# Patient Record
Sex: Female | Born: 1975 | Race: White | Hispanic: No | Marital: Married | Smoking: Current every day smoker
Health system: Southern US, Community
[De-identification: ages and names within clinical notes are randomized; demographics above are authoritative.]

## PROBLEM LIST (undated history)

## (undated) HISTORY — PX: KIDNEY STONE SURGERY: SHX686

## (undated) HISTORY — PX: ABDOMINAL HYSTERECTOMY: SHX81

---

## 2000-05-12 ENCOUNTER — Encounter (INDEPENDENT_AMBULATORY_CARE_PROVIDER_SITE_OTHER): Payer: Self-pay

## 2000-05-12 ENCOUNTER — Other Ambulatory Visit: Admission: RE | Admit: 2000-05-12 | Discharge: 2000-05-12 | Payer: Self-pay | Admitting: Obstetrics and Gynecology

## 2000-06-02 ENCOUNTER — Encounter (INDEPENDENT_AMBULATORY_CARE_PROVIDER_SITE_OTHER): Payer: Self-pay | Admitting: Specialist

## 2000-06-02 ENCOUNTER — Other Ambulatory Visit: Admission: RE | Admit: 2000-06-02 | Discharge: 2000-06-02 | Payer: Self-pay | Admitting: Obstetrics and Gynecology

## 2000-08-20 ENCOUNTER — Other Ambulatory Visit: Admission: RE | Admit: 2000-08-20 | Discharge: 2000-08-20 | Payer: Self-pay | Admitting: Obstetrics and Gynecology

## 2000-12-06 ENCOUNTER — Inpatient Hospital Stay (HOSPITAL_COMMUNITY): Admission: AD | Admit: 2000-12-06 | Discharge: 2000-12-06 | Payer: Self-pay | Admitting: *Deleted

## 2001-03-19 ENCOUNTER — Inpatient Hospital Stay (HOSPITAL_COMMUNITY): Admission: AD | Admit: 2001-03-19 | Discharge: 2001-03-22 | Payer: Self-pay | Admitting: Obstetrics and Gynecology

## 2001-05-27 ENCOUNTER — Encounter (INDEPENDENT_AMBULATORY_CARE_PROVIDER_SITE_OTHER): Payer: Self-pay

## 2001-05-27 ENCOUNTER — Ambulatory Visit (HOSPITAL_COMMUNITY): Admission: RE | Admit: 2001-05-27 | Discharge: 2001-05-27 | Payer: Self-pay | Admitting: Obstetrics and Gynecology

## 2001-06-21 ENCOUNTER — Other Ambulatory Visit: Admission: RE | Admit: 2001-06-21 | Discharge: 2001-06-21 | Payer: Self-pay | Admitting: Obstetrics and Gynecology

## 2002-09-14 ENCOUNTER — Other Ambulatory Visit: Admission: RE | Admit: 2002-09-14 | Discharge: 2002-09-14 | Payer: Self-pay | Admitting: Obstetrics and Gynecology

## 2002-12-13 ENCOUNTER — Emergency Department (HOSPITAL_COMMUNITY): Admission: EM | Admit: 2002-12-13 | Discharge: 2002-12-13 | Payer: Self-pay | Admitting: Emergency Medicine

## 2002-12-13 ENCOUNTER — Encounter: Payer: Self-pay | Admitting: Emergency Medicine

## 2003-04-04 ENCOUNTER — Encounter: Payer: Self-pay | Admitting: Family Medicine

## 2003-04-04 ENCOUNTER — Ambulatory Visit (HOSPITAL_COMMUNITY): Admission: RE | Admit: 2003-04-04 | Discharge: 2003-04-04 | Payer: Self-pay | Admitting: Family Medicine

## 2003-07-22 ENCOUNTER — Emergency Department (HOSPITAL_COMMUNITY): Admission: EM | Admit: 2003-07-22 | Discharge: 2003-07-22 | Payer: Self-pay | Admitting: Emergency Medicine

## 2003-09-18 ENCOUNTER — Other Ambulatory Visit: Admission: RE | Admit: 2003-09-18 | Discharge: 2003-09-18 | Payer: Self-pay | Admitting: Obstetrics and Gynecology

## 2003-09-21 ENCOUNTER — Encounter: Admission: RE | Admit: 2003-09-21 | Discharge: 2003-09-21 | Payer: Self-pay | Admitting: Obstetrics and Gynecology

## 2004-01-04 ENCOUNTER — Ambulatory Visit (HOSPITAL_COMMUNITY): Admission: RE | Admit: 2004-01-04 | Discharge: 2004-01-04 | Payer: Self-pay | Admitting: Family Medicine

## 2004-01-16 ENCOUNTER — Ambulatory Visit (HOSPITAL_COMMUNITY): Admission: RE | Admit: 2004-01-16 | Discharge: 2004-01-16 | Payer: Self-pay | Admitting: Family Medicine

## 2004-05-15 ENCOUNTER — Ambulatory Visit (HOSPITAL_COMMUNITY): Admission: RE | Admit: 2004-05-15 | Discharge: 2004-05-15 | Payer: Self-pay | Admitting: Family Medicine

## 2004-05-21 ENCOUNTER — Ambulatory Visit (HOSPITAL_COMMUNITY): Admission: RE | Admit: 2004-05-21 | Discharge: 2004-05-21 | Payer: Self-pay | Admitting: Family Medicine

## 2004-06-04 ENCOUNTER — Ambulatory Visit (HOSPITAL_COMMUNITY): Admission: RE | Admit: 2004-06-04 | Discharge: 2004-06-04 | Payer: Self-pay | Admitting: Family Medicine

## 2004-10-02 ENCOUNTER — Other Ambulatory Visit: Admission: RE | Admit: 2004-10-02 | Discharge: 2004-10-02 | Payer: Self-pay | Admitting: Obstetrics and Gynecology

## 2004-10-12 ENCOUNTER — Emergency Department (HOSPITAL_COMMUNITY): Admission: EM | Admit: 2004-10-12 | Discharge: 2004-10-12 | Payer: Self-pay | Admitting: Emergency Medicine

## 2004-11-01 ENCOUNTER — Emergency Department (HOSPITAL_COMMUNITY): Admission: EM | Admit: 2004-11-01 | Discharge: 2004-11-01 | Payer: Self-pay | Admitting: Emergency Medicine

## 2004-11-06 ENCOUNTER — Ambulatory Visit (HOSPITAL_COMMUNITY): Admission: RE | Admit: 2004-11-06 | Discharge: 2004-11-06 | Payer: Self-pay | Admitting: Urology

## 2004-12-30 ENCOUNTER — Ambulatory Visit: Payer: Self-pay | Admitting: Internal Medicine

## 2005-01-06 ENCOUNTER — Ambulatory Visit (HOSPITAL_COMMUNITY): Admission: RE | Admit: 2005-01-06 | Discharge: 2005-01-06 | Payer: Self-pay | Admitting: Internal Medicine

## 2005-01-06 ENCOUNTER — Ambulatory Visit: Payer: Self-pay | Admitting: Internal Medicine

## 2005-04-12 IMAGING — CT CT PELVIS W/O CM
1 of 2 series · 15 of 32 positions shown, 19 images · non-contrast
Comparison: none

CLINICAL DATA: left flank pain. History of bilateral renal stones.  Low back pain.
 CT ABDOMEN W/O CONTRAST
 A series of scans of the entire abdomen are made without contrast and are compared to the previous studies of 12/13/02 and show again bilateral renal calculi, the largest appear to be on the left side and measure up to 4 x 7 millimeter.  There is no hydronephrosis or hydroureter at this time. No obstruction or stone of either ureter is seen.
 The lower lung fields, lower thoracic spine and upper lumbar spine appear normal. There is no obstruction or perforation of the bowel. The liver and gallbladder appear normal as far as can be seen.  The pancreas and spleen are normal.
 IMPRESSION
 Bilateral renal calculi with no definite hydronephrosis or hydroureter.  No ureteral calculi are seen.
 CT PELVIS W/O CONTRAST
 CT scan of the pelvis without contrast is made and shows no evidence of distal ureteral calculus or obstruction.  The bladder appears normal.  Uterus is within normal limits. The ovaries are not enlarged. No free fluid or air is seen within the pelvis.  The bones of the pelvis appear normal. Lower lumbar spine is within normal limits.
 Compared to the previous study of 12/13/02, the left distal ureteral calculus now appears to have passed.
 Normal CT scan of the pelvis without contrast.

[Series 70: — · axial · 0.65mm/px · z∈[+1288,+1663]mm · 15 of 83 slices shown, 19 images]
[im 4/83  soft-tissue]
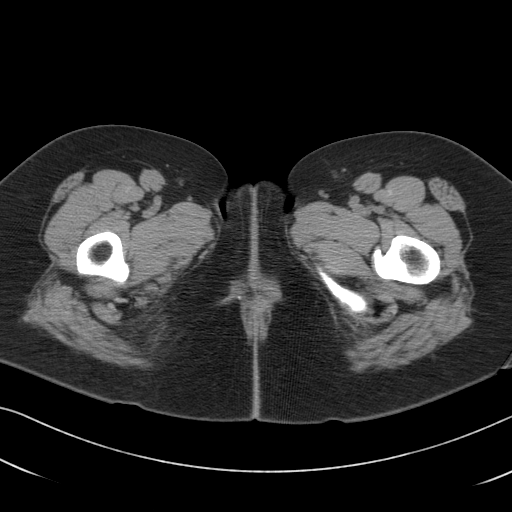
[im 4/83  bone]
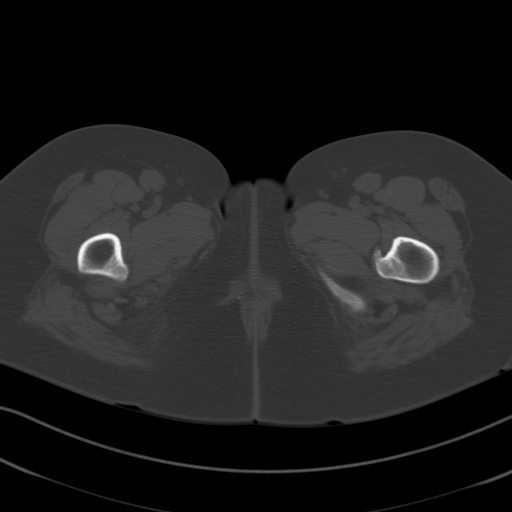
[im 11/83  soft-tissue]
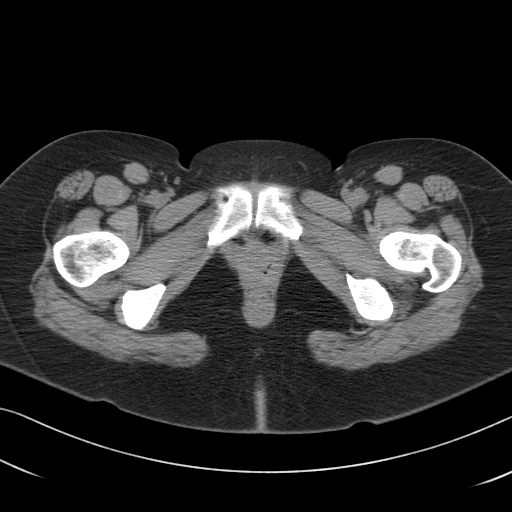
[im 18/83  soft-tissue]
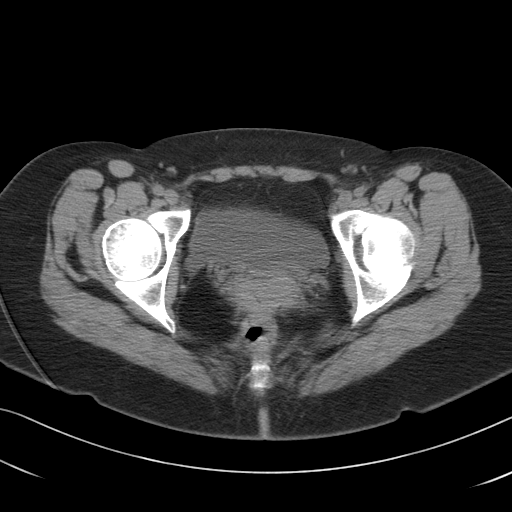
[im 24/83  soft-tissue]
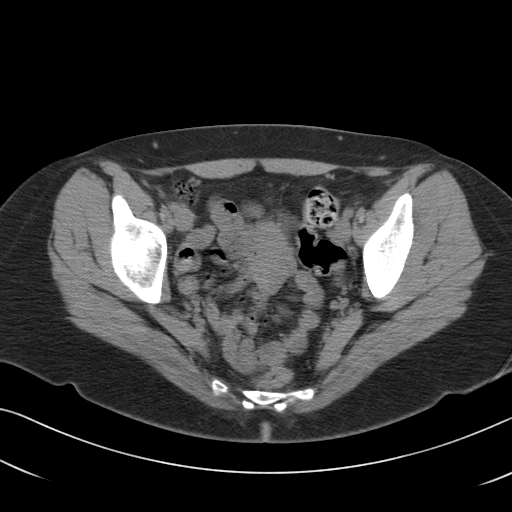
[im 28/83  soft-tissue]
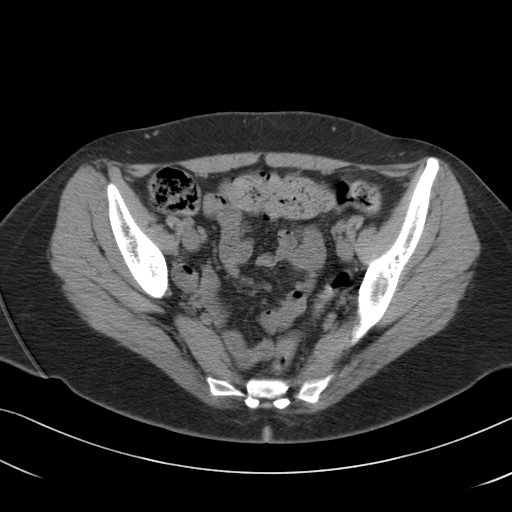
[im 35/83  soft-tissue]
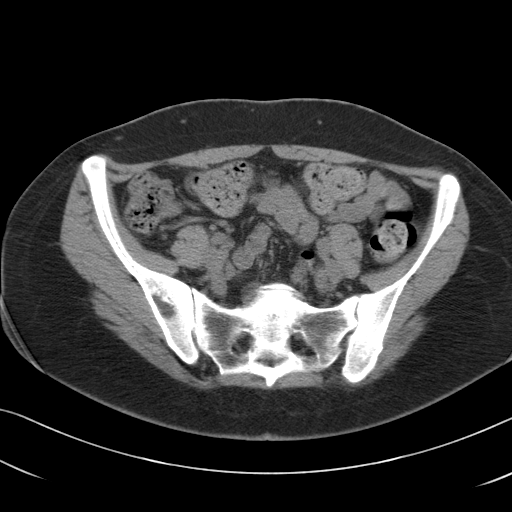
[im 42/83  soft-tissue]
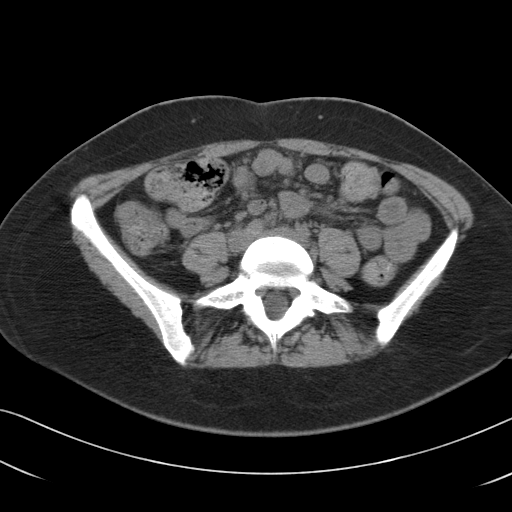
[im 48/83  soft-tissue]
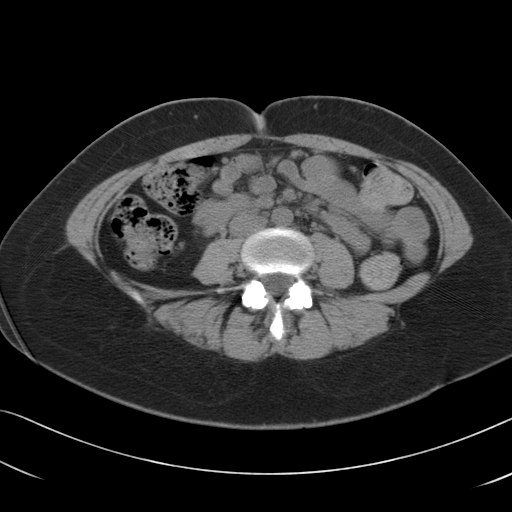
[im 55/83  soft-tissue]
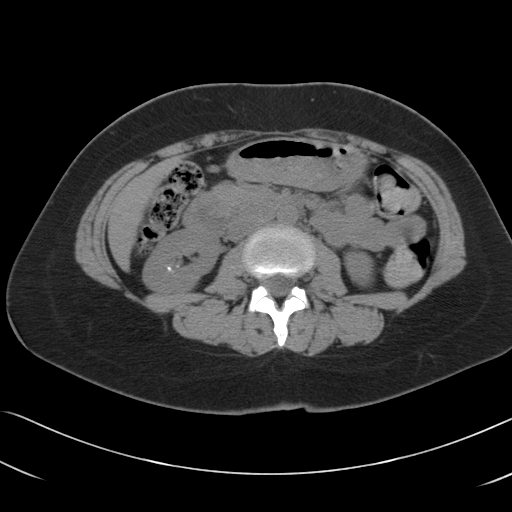
[im 55/83  bone]
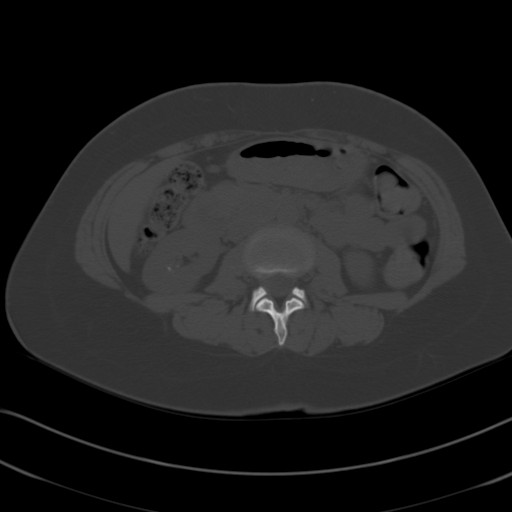
[im 59/83  soft-tissue]
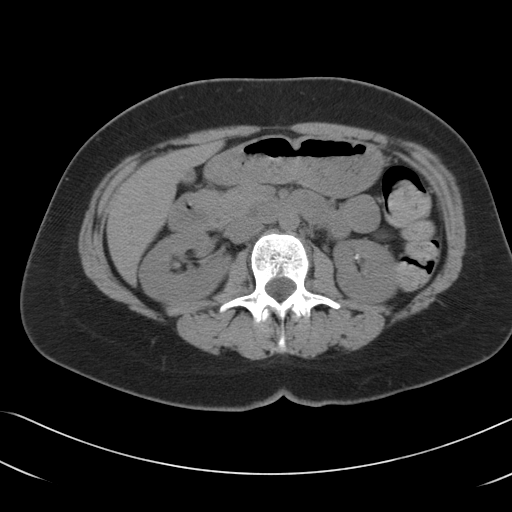
[im 65/83  soft-tissue]
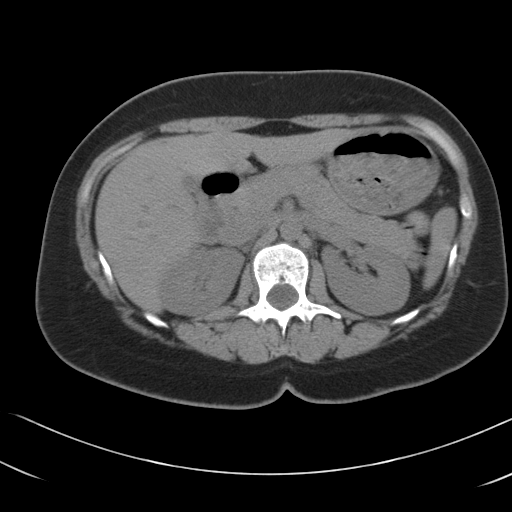
[im 69/83  lung]
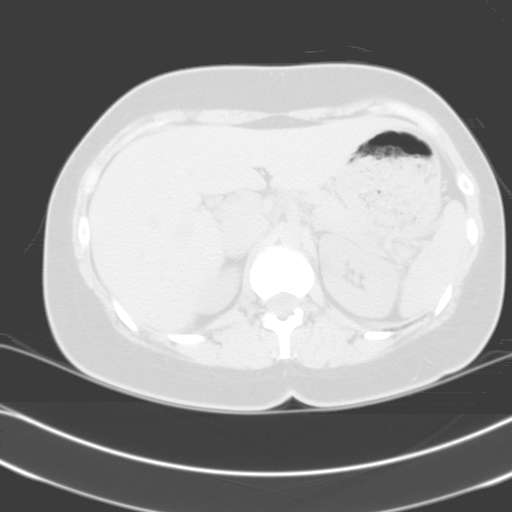
[im 72/83  soft-tissue]
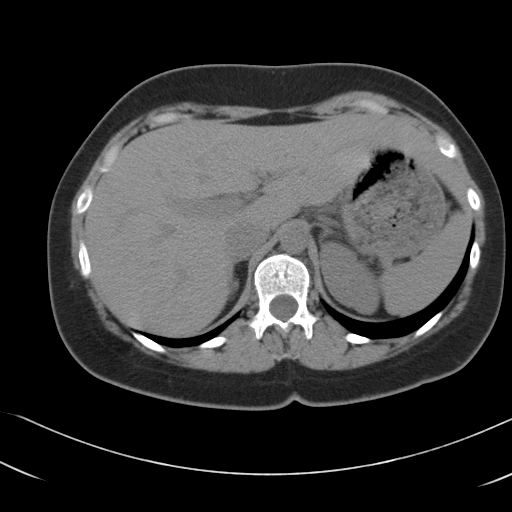
[im 72/83  lung]
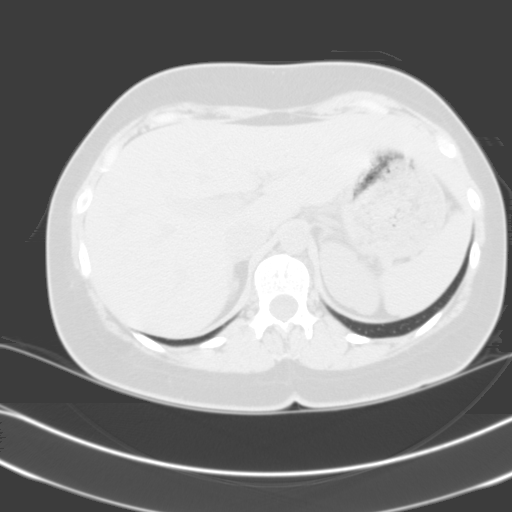
[im 76/83  lung]
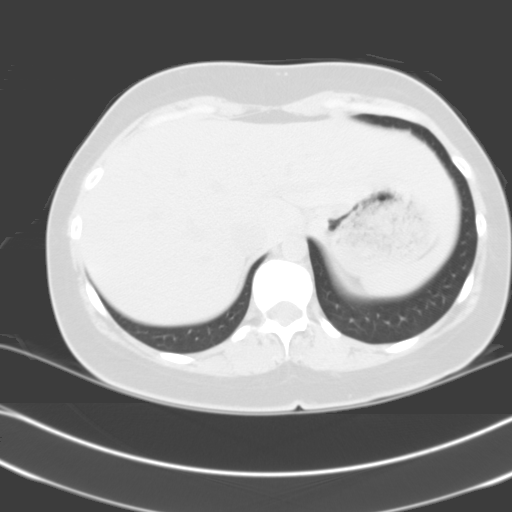
[im 79/83  soft-tissue]
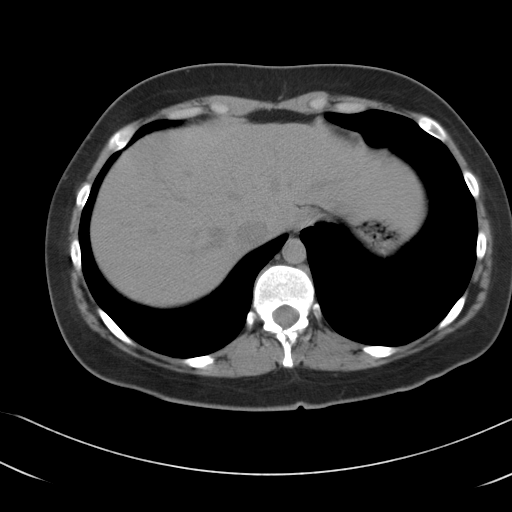
[im 79/83  lung]
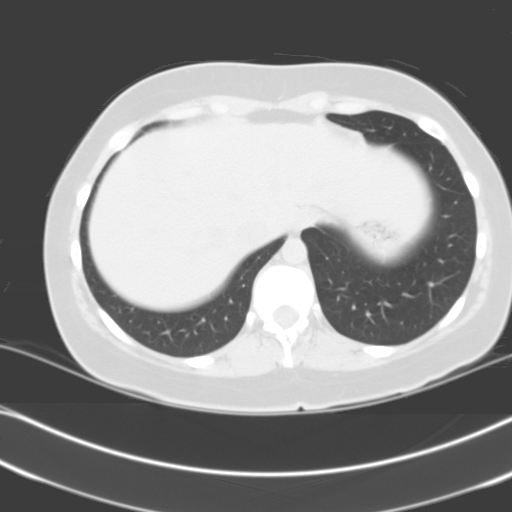

[15 of 32 positions shown; findings below may reference images not displayed]

## 2005-04-23 ENCOUNTER — Encounter (INDEPENDENT_AMBULATORY_CARE_PROVIDER_SITE_OTHER): Payer: Self-pay | Admitting: *Deleted

## 2005-04-23 ENCOUNTER — Ambulatory Visit (HOSPITAL_COMMUNITY): Admission: RE | Admit: 2005-04-23 | Discharge: 2005-04-23 | Payer: Self-pay | Admitting: Obstetrics and Gynecology

## 2005-07-02 ENCOUNTER — Ambulatory Visit (HOSPITAL_COMMUNITY): Admission: RE | Admit: 2005-07-02 | Discharge: 2005-07-02 | Payer: Self-pay | Admitting: Urology

## 2005-10-15 ENCOUNTER — Ambulatory Visit (HOSPITAL_COMMUNITY): Admission: RE | Admit: 2005-10-15 | Discharge: 2005-10-15 | Payer: Self-pay | Admitting: Urology

## 2005-11-17 ENCOUNTER — Other Ambulatory Visit: Admission: RE | Admit: 2005-11-17 | Discharge: 2005-11-17 | Payer: Self-pay | Admitting: Obstetrics and Gynecology

## 2007-09-07 ENCOUNTER — Inpatient Hospital Stay (HOSPITAL_COMMUNITY): Admission: AD | Admit: 2007-09-07 | Discharge: 2007-09-07 | Payer: Self-pay | Admitting: Obstetrics and Gynecology

## 2008-04-17 ENCOUNTER — Inpatient Hospital Stay (HOSPITAL_COMMUNITY): Admission: AD | Admit: 2008-04-17 | Discharge: 2008-04-18 | Payer: Self-pay | Admitting: Obstetrics and Gynecology

## 2008-05-02 ENCOUNTER — Inpatient Hospital Stay (HOSPITAL_COMMUNITY): Admission: AD | Admit: 2008-05-02 | Discharge: 2008-05-05 | Payer: Self-pay | Admitting: Obstetrics and Gynecology

## 2009-12-09 ENCOUNTER — Ambulatory Visit (HOSPITAL_BASED_OUTPATIENT_CLINIC_OR_DEPARTMENT_OTHER): Admission: RE | Admit: 2009-12-09 | Discharge: 2009-12-09 | Payer: Self-pay | Admitting: Urology

## 2009-12-21 ENCOUNTER — Emergency Department (HOSPITAL_COMMUNITY): Admission: EM | Admit: 2009-12-21 | Discharge: 2009-12-21 | Payer: Self-pay | Admitting: Emergency Medicine

## 2010-12-11 ENCOUNTER — Emergency Department (HOSPITAL_COMMUNITY)
Admission: EM | Admit: 2010-12-11 | Discharge: 2010-12-11 | Payer: Self-pay | Source: Home / Self Care | Admitting: Emergency Medicine

## 2010-12-11 LAB — DIFFERENTIAL
Basophils Absolute: 0 10*3/uL (ref 0.0–0.1)
Basophils Relative: 0 % (ref 0–1)
Eosinophils Absolute: 0 10*3/uL (ref 0.0–0.7)
Eosinophils Relative: 0 % (ref 0–5)
Lymphocytes Relative: 7 % — ABNORMAL LOW (ref 12–46)
Lymphs Abs: 1.1 10*3/uL (ref 0.7–4.0)
Monocytes Absolute: 0.6 10*3/uL (ref 0.1–1.0)
Monocytes Relative: 4 % (ref 3–12)
Neutro Abs: 13.9 10*3/uL — ABNORMAL HIGH (ref 1.7–7.7)
Neutrophils Relative %: 89 % — ABNORMAL HIGH (ref 43–77)

## 2010-12-11 LAB — COMPREHENSIVE METABOLIC PANEL
ALT: 13 U/L (ref 0–35)
AST: 20 U/L (ref 0–37)
Albumin: 4.3 g/dL (ref 3.5–5.2)
Alkaline Phosphatase: 59 U/L (ref 39–117)
BUN: 18 mg/dL (ref 6–23)
CO2: 25 mEq/L (ref 19–32)
Calcium: 9.7 mg/dL (ref 8.4–10.5)
Chloride: 102 mEq/L (ref 96–112)
Creatinine, Ser: 0.79 mg/dL (ref 0.4–1.2)
GFR calc Af Amer: >60 mL/min (ref >60)
GFR calc non Af Amer: >60 mL/min (ref >60)
Glucose, Bld: 96 mg/dL (ref 70–99)
Potassium: 4.5 mEq/L (ref 3.5–5.1)
Sodium: 139 mEq/L (ref 135–145)
Total Bilirubin: 0.7 mg/dL (ref 0.3–1.2)
Total Protein: 8.2 g/dL (ref 6.0–8.3)

## 2010-12-11 LAB — URINALYSIS, ROUTINE W REFLEX MICROSCOPIC
Ketones, ur: 15 mg/dL — AB
Leukocytes, UA: NEGATIVE
Nitrite: NEGATIVE
Specific Gravity, Urine: >1.03 — ABNORMAL HIGH (ref 1.005–1.030)
Urine Glucose, Fasting: NEGATIVE mg/dL
Urobilinogen, UA: 0.2 mg/dL (ref 0.0–1.0)
pH: 5.5 (ref 5.0–8.0)

## 2010-12-11 LAB — CBC
HCT: 45.9 % (ref 36.0–46.0)
Hemoglobin: 16.1 g/dL — ABNORMAL HIGH (ref 12.0–15.0)
MCH: 33 pg (ref 26.0–34.0)
MCHC: 35.1 g/dL (ref 30.0–36.0)
MCV: 94.1 fL (ref 78.0–100.0)
Platelets: 444 10*3/uL — ABNORMAL HIGH (ref 150–400)
RBC: 4.88 MIL/uL (ref 3.87–5.11)
RDW: 13.5 % (ref 11.5–15.5)
WBC: 15.6 10*3/uL — ABNORMAL HIGH (ref 4.0–10.5)

## 2010-12-11 LAB — URINE MICROSCOPIC-ADD ON

## 2010-12-18 ENCOUNTER — Encounter (HOSPITAL_COMMUNITY)
Admission: RE | Admit: 2010-12-18 | Discharge: 2010-12-18 | Disposition: A | Discharge status: Home / Self Care | Payer: BC Managed Care – PPO | Source: Ambulatory Visit | Attending: Obstetrics and Gynecology | Admitting: Obstetrics and Gynecology

## 2010-12-18 DIAGNOSIS — Z01812 Encounter for preprocedural laboratory examination: Secondary | ICD-10-CM | POA: Insufficient documentation

## 2010-12-18 LAB — SURGICAL PCR SCREEN
MRSA, PCR: NEGATIVE
Staphylococcus aureus: NEGATIVE

## 2010-12-18 LAB — CBC
Hemoglobin: 12.7 g/dL (ref 12.0–15.0)
MCH: 31.5 pg (ref 26.0–34.0)
Platelets: 444 10*3/uL — ABNORMAL HIGH (ref 150–400)
RBC: 4.03 MIL/uL (ref 3.87–5.11)
WBC: 11.8 10*3/uL — ABNORMAL HIGH (ref 4.0–10.5)

## 2010-12-25 ENCOUNTER — Other Ambulatory Visit: Payer: Self-pay | Admitting: Obstetrics and Gynecology

## 2010-12-25 ENCOUNTER — Ambulatory Visit (HOSPITAL_COMMUNITY)
Admission: RE | Admit: 2010-12-25 | Discharge: 2010-12-26 | Disposition: A | Discharge status: Home / Self Care | Payer: BC Managed Care – PPO | Source: Ambulatory Visit | Attending: Obstetrics and Gynecology | Admitting: Obstetrics and Gynecology

## 2010-12-25 DIAGNOSIS — IMO0002 Reserved for concepts with insufficient information to code with codable children: Secondary | ICD-10-CM | POA: Insufficient documentation

## 2010-12-25 DIAGNOSIS — N938 Other specified abnormal uterine and vaginal bleeding: Secondary | ICD-10-CM | POA: Insufficient documentation

## 2010-12-25 DIAGNOSIS — N949 Unspecified condition associated with female genital organs and menstrual cycle: Secondary | ICD-10-CM | POA: Insufficient documentation

## 2010-12-25 LAB — PREGNANCY, URINE: Preg Test, Ur: NEGATIVE

## 2010-12-26 LAB — CBC
Platelets: 368 10*3/uL (ref 150–400)
RBC: 3.12 MIL/uL — ABNORMAL LOW (ref 3.87–5.11)
RDW: 13.3 % (ref 11.5–15.5)
WBC: 18.7 10*3/uL — ABNORMAL HIGH (ref 4.0–10.5)

## 2010-12-30 NOTE — Discharge Summary (Addendum)
  NAMELAWRENCE, [name]              ACCOUNT NO.:  [id number]  MEDICAL RECORD NO.:  [id number]           PATIENT TYPE:  I  LOCATION:  9312                          FACILITY:  [facility]  PHYSICIAN:  Dineen Kid. Rana Snare, M.D.    DATE OF BIRTH:  [birth year]  DATE OF ADMISSION:  12/25/2010 DATE OF DISCHARGE:  12/26/2010                              DISCHARGE SUMMARY   HISTORY OF PRESENT ILLNESS:  [name] is a 35 year old, G2, P2, with worsening pelvic pain.  She had abnormal bleeding and pelvic pain with intercourse.  She has failed conservative medical management including birth control pills.  She desires definitive surgical intervention and presents for hysterectomy.  HOSPITAL COURSE:  The patient underwent laparoscopic-assisted vaginal hysterectomy.  The surgery was uncomplicated with 150 mL of blood loss. Her postoperative care was also unremarkable.  She had good return of bowel function ambulation.  She remained afebrile.  Her postoperative hemoglobin was 9.7.  On hospital day #1, she had normoactive bowel sounds, tolerating regular diet, voiding without difficulty.  Pain was well controlled with oral medication.  Hemoglobin was 9.7.  The patient desired discharge home and was subsequently discharged.  DISPOSITION:  The patient will be discharged home with followup in the office in 2 weeks.  She has a prescription for Tylox, #30, to take as needed.  She is told to return for increased pain, fever, bleeding, or fever greater than 100.5.     Dineen Kid Rana Snare, M.D.     DCL/MEDQ  D:  12/26/2010  T:  12/26/2010  Job:  [id number]  Electronically Signed by Candice Camp M.D. on 12/30/2010 12:59:52 PM

## 2010-12-30 NOTE — H&P (Addendum)
  NAMEJACOBI, RYANT              ACCOUNT NO.:  [id number]  MEDICAL RECORD NO.:  [id number]           PATIENT TYPE:  LOCATION:                                 FACILITY:  PHYSICIAN:  Dineen Kid. Rana Snare, M.D.    DATE OF BIRTH:  1976-05-30  DATE OF ADMISSION:  12/25/2010 DATE OF DISCHARGE:                             HISTORY & PHYSICAL   HISTORY OF PRESENT ILLNESS:  Ms. [name] is a 35 year old G2, P2, with worsening pelvic pain, abnormal bleeding, and dyspareunia and has failed conservative medical management including birth control pills.  She continues to have bleeding not only midcycle but pretty much anytime, a lot of cramping, especially with intercourse.  She has no further childbearing desires.  She does desire hysterectomy.  Underwent a recent ultrasound showing a normal appearing endometrial stripe suspicious for adenomyosis.  PAST MEDICAL HISTORY:  Significant for history of kidney stones and 2 vaginal births and mild depression.  MEDICATIONS:  She is currently on the [facility] birth control pills and Celexa 10 mg daily.  ALLERGIES:  She has no known drug allergies.  PHYSICAL EXAMINATION:  VITAL SIGNS:  Her blood pressure is 120/74. HEART:  Regular rate and rhythm. LUNGS: Clear to auscultation bilaterally. ABDOMEN:  Nondistended and nontender. PELVIC:  Normal external genitalia, Bartholin, Skene and urethra.  The uterus is anteverted, mobile, and nontender.  No adnexal masses are palpable.  IMPRESSION AND PLAN:  Pelvic pain, abnormal bleeding, and dyspareunia. She has failed conservative medical management.  The patient desires to proceed with hysterectomy.  Plan, laparoscopic-assisted vaginal hysterectomy.  I had a lengthy discussion with [name] regarding the risks and benefits.  The risks include but not limited to risk of infection, bleeding, damage to the bowel, bladder, ureters, ovaries, risks associated with anesthesia, with blood transfusion, possibility  that it may not alleviate the pain, it could recur or worsen.  She does give her informed consent and wished to proceed.     Dineen Kid Rana Snare, M.D.     DCL/MEDQ  D:  12/24/2010  T:  12/25/2010  Job:  [id number]  Electronically Signed by Candice Camp M.D. on 12/30/2010 12:59:48 PM

## 2010-12-30 NOTE — Op Note (Addendum)
[name], [name]              ACCOUNT NO.:  [id number]  MEDICAL RECORD NO.:  [id number]           PATIENT TYPE:  I  LOCATION:  9312                          FACILITY:  [facility]  PHYSICIAN:  Dineen Kid. Rana Snare, M.D.    DATE OF BIRTH:  1976-04-25  DATE OF PROCEDURE:  12/25/2010 DATE OF DISCHARGE:                              OPERATIVE REPORT   PREOPERATIVE DIAGNOSES: 1. Abnormal uterine bleeding. 2. Pelvic pain. 3. Dyspareunia.  POSTOPERATIVE DIAGNOSES: 1. Abnormal uterine bleeding. 2. Pelvic pain. 3. Dyspareunia.  PROCEDURE:  Laparoscopic-assisted vaginal hysterectomy.  SURGEON:  Dineen Kid. Rana Snare, MD  ANESTHESIA:  General endotracheal.  INDICATIONS:  [name] is a 35 year old G2, P2, with worsening pelvic pain, bleeding, and dyspareunia for which she failed conservative medical management which include birth control pills.  She continues to have bleeding, not limited by cycle, which occurs at any time, lot of cramping especially with intercourse.  She has no further childbearing desires.  She presents for hysterectomy.  The risks and benefits of procedure were discussed at length which included but not limited to risk of infection, bleeding, damage to the bowel, bladder, ureters, ovaries, risks associated with anesthesia with associated blood transfusion, possibility that the pain may not improve or could recur. She gives informed consent and wished to proceed.  FINDINGS AT THE TIME OF SURGERY:  Normal-appearing liver, gallbladder, appendix.  Uterus appears to be normal.  Normal fallopian tubes and ovaries.  No evidence of endometriosis noted in the cul-de-sac.  DESCRIPTION OF PROCEDURE:  After adequate analgesia, the patient was placed in the dorsal lithotomy position.  She was sterilely prepped and draped.  Bladder was sterilely drained.  Graves speculum was placed.  A Hulka tenaculum was placed on the cervix.  A 1-cm infraumbilical skin incision was made.  A Veress needle  was inserted.  The abdomen was insufflated with dullness to percussion.  An 11-mm trocar was inserted. A 5-mm trocar was inserted left to the midline 2 fingerbreadths above the pubic symphysis under direct visualization.  After careful and systematic evaluation of the abdomen and pelvis, the gyrus cutting forceps were used to coagulate and dissect across the left fallopian tube, left uteroovarian ligament, and round ligament with the left tube and ovary falling laterally with good hemostasis achieved.  The right fallopian tube, round ligament, and uteroovarian ligament were ligated in similar fashion and dissected with the right ovary and tube falling laterally with good hemostasis achieved.  The bladder was then elevated. The uterovesical junction was noted and a small window was made below the bladder, creating a small bladder flap.  The abdomen was then desufflated.  The legs were repositioned.  A weighted speculum was placed in the vagina.  The posterior colpotomy was performed.  Cervix was then circumscribed with Bovie cautery.  LigaSure instrument was used to ligate across the uterosacral ligaments and cardinal ligaments bilaterally, dissected with Mayo scissors.  The anterior vaginal mucosa was dissected and anterior peritoneum was entered and Deaver retractor was placed underneath the bladder.  The uterine vessels were ligated with a LigaSure instrument as was the inferior portion of the broad  ligaments bilaterally, dissected with Mayo scissors.  The uterus was then removed.  A ribbon pack was placed, holding the bowel out of our way.  The uterosacral ligaments were identified and ligated with figure- of-eight of 0 Monocryl suture bilaterally.  Small bleeder in the vagina on the left side was noted and a second figure-of-eight of 0 Monocryl suture was placed with good hemostasis achieved.  After careful evaluation and good hemostasis achieved, the posterior peritoneum was then  closed in a pursestring fashion with 0 Monocryl suture.  The vagina was then closed in a vertical fashion, plicating the uterosacral ligaments in the midline and closing the vaginal mucosa with figure-of- eights of 0 Monocryl suture.  The packing was removed and the vagina was completely closed with figure-of-eights of 0 Monocryl suture.  Good approximation and good hemostasis was noted.  Foley catheter was placed with return of clear yellow urine.  The legs were repositioned.  The abdomen was reinsufflated.  Nezhat suction irrigator was used to irrigate the abdomen and pelvis.  Small peritoneal bleeders were coagulated with bipolar cautery.  Both ovaries appeared to be normal. Good peristalsis of the ureter was noted underneath the ovaries with no obvious evidence of trauma to the ureters.  The pedicles were dried with good hemostasis noted.  After copious amount of irrigation, adequate hemostasis had been assured.  The abdomen was then desufflated.  Trocars were removed.  Infraumbilical skin incision was closed with 0 Vicryl interrupted suture in the fascia, 3-0 Vicryl Rapide subcuticular suture. The 5-mm site was closed with 3-0 Vicryl Rapide subcuticular suture and Dermabond.  The incisions were injected with 0.25% Marcaine, total of 10 mL used.  The patient was stable on transfer to recovery room.  Sponge and instrument count was normal x3.  Estimated blood loss 150 mL.  The patient received 1 g of cefotetan preoperatively.     Dineen Kid Rana Snare, M.D.     DCL/MEDQ  D:  12/25/2010  T:  12/25/2010  Job:  [id number]  Electronically Signed by Candice Camp M.D. on 12/30/2010 12:59:50 PM

## 2011-02-01 LAB — POCT HEMOGLOBIN-HEMACUE: Hemoglobin: 13.1 g/dL (ref 12.0–15.0)

## 2011-02-01 LAB — POCT PREGNANCY, URINE: Preg Test, Ur: NEGATIVE

## 2011-02-04 LAB — COMPREHENSIVE METABOLIC PANEL
ALT: 9 U/L (ref 0–35)
AST: 13 U/L (ref 0–37)
CO2: 28 mEq/L (ref 19–32)
Calcium: 8.9 mg/dL (ref 8.4–10.5)
Creatinine, Ser: 0.63 mg/dL (ref 0.4–1.2)
GFR calc Af Amer: >60 mL/min (ref >60)
GFR calc non Af Amer: >60 mL/min (ref >60)
Sodium: 137 mEq/L (ref 135–145)
Total Protein: 6.9 g/dL (ref 6.0–8.3)

## 2011-02-04 LAB — POCT CARDIAC MARKERS
CKMB, poc: <1 ng/mL — ABNORMAL LOW (ref 1.0–8.0)
Troponin i, poc: <0.05 ng/mL (ref 0.00–0.09)

## 2011-04-03 NOTE — Consult Note (Signed)
[name], Case              ACCOUNT NO.:  [id number]   MEDICAL RECORD NO.:  [id number]          PATIENT TYPE:  AMB   LOCATION:                                 FACILITY:   PHYSICIAN:  R. Roetta Sessions, M.D. DATE OF BIRTH:  1976/04/21   DATE OF CONSULTATION:  DATE OF DISCHARGE:                                   CONSULTATION   REASON FOR CONSULTATION:  Hematochezia.   HISTORY OF PRESENT ILLNESS:  [name] is a pleasant 35 year old Caucasian  female sent over at the courtesy of Dr. Yetta Numbers to further evaluate  several episodes of hematochezia recently. She typically  moves her bowels  two to three times weekly and occasionally has significant bouts of  constipation, not going for a week or more. Recently she had episodes of  gross blood per rectum intermixed with the stool and covering the stool  without associated anorectal pain or abdominal pain. She has never had  diarrhea. She had not had any change in weight. No upper GI tract symptoms  such as odynophagia, dysphagia, early satiety, reflux symptoms, nausea, or  vomiting. She has never had her lower GI tract imaged. There is no family  history of inflammatory bowel disease or colorectal cancer.   PAST MEDICAL HISTORY:  1.  Significant for migraine headaches.  2.  History of nephrolithiasis status post ureteroscopic kidney stone      extraction, Dr. Vernie Ammons, back in December.  3.  History of D&C and treatment of cervical lesion previously.   CURRENT MEDICATIONS:  1.  Birth control pills.  2.  Antibiotic for sinusitis.   ALLERGIES:  No known drug allergies.   FAMILY HISTORY:  Father has history of colonic polyps and is on a  surveillance program. Mother is in good health. Otherwise, no history of  chronic GI or liver illness.   SOCIAL HISTORY:  The patient is married and has a 48-year-old girl. 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 She  recently started smoking one pack of  cigarettes per day. No alcohol or  illicit drugs.   REVIEW OF SYSTEMS:  As in history of present illness.   PHYSICAL EXAMINATION:  GENERAL:  Reveals a 35 year old lady resting  comfortably accompanied by her mother.  VITAL SIGNS:  Weight 142, height 5 foot 6, temperature 98.3, BP 116/66,  pulse 80.  SKIN:  Warm and dry. No jaundice. No __________ stigmata of chronic liver  disease.  HEENT:  No sclerae icterus. JVD is not prominent.  CHEST:  Lungs are clear to auscultation.  CARDIAC:  Regular rate and rhythm without murmurs, gallops, or rubs.  ABDOMEN:  Nondistended. Positive bowel sounds, soft, nontender without  appreciable mass or organomegaly.  EXTREMITIES:  No edema.  RECTAL:  Has been declined by the patient today.   IMPRESSION:  [name] is a pleasant 35 year old lady in the  setting of chronic constipation has had painless hematochezia recently. Not  mentioned, H and H was done by Dr. Vernie Ammons down in [place] back in  December. She has a H and H  of 13.1 and 38.1 at that time. Do not have any  recent labs on her at this time.   Discussed the potential etiologies of rectal bleeding with [name] and her  mother who accompanies her today. I really think the most prudent approach  would be to proceed with a colonoscopy. I have discussed risks, benefits,  and alternatives with the patient and the patient's mother. All questions  were answered. All parties are agreeable. Plan to perform colonoscopy in the  very near future. Further recommendations to follow.   I would like to thank Dr. Regino Schultze for allowing me to see this nice lady  today.      RMR/MEDQ  D:  12/30/2004  T:  12/30/2004  Job:  [id number]   cc:   Kirk Ruths, M.D.  [address]  [place]  [place] [address]  Fax: [phone]

## 2011-04-03 NOTE — H&P (Signed)
[name], [name]              ACCOUNT NO.:  [id number]   MEDICAL RECORD NO.:  [id number]          PATIENT TYPE:  AMB   LOCATION:  DAY                          FACILITY:  [facility]   PHYSICIAN:  Mark C. Vernie Ammons, M.D.  DATE OF BIRTH:  1976/11/04   DATE OF ADMISSION:  11/06/2004  DATE OF DISCHARGE:                                HISTORY & PHYSICAL   OFFICE RECORD [phone]   HISTORY OF PRESENT ILLNESS:  The patient is a 35 year old white female self-  referred for further evaluation of a kidney stone.  She passed a small stone  about 2 years ago.  She more recently - about a week-and-a-half ago -  developed an episode of gross hematuria with some abdominal cramping.  She  saw her gynecologist who confirmed that the blood was coming from the  urinary tract and thought she may have some infection, started her on an  antibiotic.  She tells me that in June she had an x-ray done at [facility]  that showed three small stones that she was told all would pass.  A week  ago, then, she developed severe pain in her lower back on the left-hand  side.  She has now developed irritated voiding symptoms.   PAST MEDICAL HISTORY:  Positive for irritable bowel syndrome and kidney  stone.   SURGICAL HISTORY:  None.   MEDICATIONS:  Birth control pills, Vicodin, an antibiotic, and tramadol  p.r.n. for migraines.   ALLERGIES:  No known drug allergies.   SOCIAL HISTORY:  She smokes one pack of cigarettes a day and said she quit  in 2001.  She denies alcohol use.   FAMILY HISTORY:  The father is 22, mother is 52.  There is hypertension and  kidney stones in the family.   REVIEW OF SYSTEMS:  Positive for constipation, frequency, and slow stream;  otherwise negative per patient checklist in the chart.   PHYSICAL EXAMINATION:  VITAL SIGNS:  Blood pressure is 125/83, pulse 122,  temperature 98.0.  GENERAL:  The patient is a well-developed, well-nourished white female in no  apparent distress.  HEENT:   Atraumatic, normocephalic, oropharynx is clear.  NECK:  Supple with midline trachea.  CHEST:  Reveals normal respiratory effort.  CARDIOVASCULAR:  Regular rate and rhythm.  ABDOMEN:  Flat, soft, and nontender without mass or HSM.  She has no  inguinal hernias or adenopathy.  GENITOURINARY:  She has normal external female genitalia.  SKIN:  Warm and dry.  EXTREMITIES:  Without clubbing, cyanosis, edema.  She has no gross focal  neurologic deficits.   Review of outside films revealed moderate left hydronephrosis and  hydroureter with bilateral calculi.  There is one stone on the right, two in  the left kidney, and a 5 mm stone in the left distal ureter near the UVJ.   Urinalysis was positive for blood; otherwise, negative.   KUB study reveals the two stones in the left kidney are easily visualized in  the lower pole.  I cannot see the stone on the right-hand side.  She does  have a calcification at the  UVJ on the left-hand side.   IMPRESSION:  1.  Bilateral renal calculi, asymptomatic.  2.  Left distal ureteral calculus with hydronephrosis and intermittent renal      colic.   We discussed her options which would be continued observation with pain  medication, straining the urine, and forcing fluids.  The other options  would be intervention with either lithotripsy or ureteroscopy.  We discussed  the risks and complications as well as merits of both ureteroscopic  extraction of her stone as well as lithotripsy.  She understands the  possible need for stent after ureteroscopy.  Understanding this, she has  elected to proceed with ureteroscopic stone extraction.  She has been n.p.o.  since 8:30 a.m. this morning.  I am going to therefore put her on the  schedule for a ureteroscopic stone extraction this afternoon at 4 p.m.  In  the meantime, I wrote her a prescription for #36 Tylox.     Mark   MCO/MEDQ  D:  11/06/2004  T:  11/06/2004  Job:  [id number]

## 2011-04-03 NOTE — Op Note (Signed)
NAMEAKI, BURDIN              ACCOUNT NO.:  [id number]   MEDICAL RECORD NO.:  [id number]          PATIENT TYPE:  AMB   LOCATION:  DAY                           FACILITY:  [facility]   PHYSICIAN:  R. Roetta Sessions, M.D. DATE OF BIRTH:  02/19/1976   DATE OF PROCEDURE:  01/06/2005  DATE OF DISCHARGE:                                 OPERATIVE REPORT   PROCEDURE:  Diagnostic colonoscopy.   INDICATIONS FOR PROCEDURE:  The patient is a 35 year old lady with chronic  constipation and intermittent painless hematochezia. Colonoscopy is now  being done. This approach has been discussed with the patient at length.  Potential risks, benefits, and alternatives have been reviewed and questions  answered. She is agreeable. Please see documentation in medical record for  more information.   PROCEDURE NOTE:  O2 saturation, blood pressure, pulse, and respirations  monitored throughout the entirety of the procedure. Conscious sedation with  Versed 5 mg IV and Demerol 125 mg IV in divided doses.   INSTRUMENT:  Olympus video chip system, pediatric colonoscope.   FINDINGS:  Digital rectal examination revealed no abnormalities.   ENDOSCOPIC FINDINGS:  Prep was good.   Rectum:  Examination of the rectal mucosa including retroflexed view of anal  verge ______________ of the anal canal demonstrated single anal papillar and  internal hemorrhoids.   Colon:  Colonic mucosa was surveyed from the rectosigmoid junction through  the left, transverse, and right colon to area of the appendiceal orifice,  ileocecal valve, and cecum. These structures were well seen and photographed  for the record. Terminal ileum was intubated to 10 cm. Olympus video scope  was slowly withdrawn, and all previously mentioned mucosal surfaces were  again seen. The colonic mucosa appeared normal. The terminal ileal mucosa  appeared normal. The patient tolerated the procedure well and was reactive  to endoscopy.   IMPRESSION:  1.   Single anal papilla and internal hemorrhoids. Otherwise normal rectum.  2.  Normal colon.  3.  Normal terminal ileum.   I suspect the patient bled from hemorrhoids in the setting of constipation.   RECOMMENDATIONS:  1.  Hemorrhoid literature given to Ms. Cacho.  2.  Crystallose laxative 20 g orally at bedtime as needed for constipation.      Ten-day course of Anusol HC suppositories 1 per rectum at bedtime. The      patient is to call if she has any future problems.      RMR/MEDQ  D:  01/06/2005  T:  01/06/2005  Job:  [id number]   cc:   Kirk Ruths, M.D.  [address]  [place]  [place] [address]  Fax: [phone]

## 2011-04-03 NOTE — Op Note (Signed)
[facility]  Patient:    [name], [name]                       MRN: [id number] Proc. Date: 03/20/01 Adm. Date:  [id number] Attending:  Frederich Balding                           Operative Report  DELIVERY NOTE  HISTORY:                      The patient is a 35 year old primigravida who presented at term with spontaneous onset of labor. She progressed to complete dilatation with Pitocin augmentation. Subsequently, she was pushing well for about two hours and the fetal vertex to arrest at about a +4 station. It was in the OA presentation. We had run into maternal exhaustion and a rise in maternal fever. The decision was made to proceed with a VE assisted vaginal delivery. The risks were discussed, including subcutaneous bleeds that could require transfusions or intracranial bleeds with possible operative intervention. The bladder was emptied by in-and-out catheterization.  DESCRIPTION OF PROCEDURE:     Tender Touch VE was put in place with the next contraction. The fetal vertex was brought to crowning. We removed the vacuum extractor at that point in time. The patient still was unable to push effectively. The VE was reapplied and the fetal vertex was brought further down with the next contraction. It was removed and subsequently, she delivered spontaneously. There was no episiotomy.  She did have a partial third-degree tear with disruption of the capsule but not a complete transection of the external sphincter muscle. The infant was a viable female whose weight is pending. Apgars were 8/99, pH is pending. The third-degree midline ______ was repaired with 2-0 chromic. The muscle capsule was brought together with interrupted figure-of-eights of 2-0 chromic. The remaining second-degree ______  was repaired in the usual manner. Anesthesia was local with 2% Nesacaine and epidural. The placenta was delivered intact with a three-vessel cord. Estimated  blood loss was 800 cc. Mother and baby were doing well in the postpartum period. DD:  03/20/01 TD:  03/20/01 Job: [id number]

## 2011-04-03 NOTE — Op Note (Signed)
NAMEHILDE, [name]              ACCOUNT NO.:  [id number]   MEDICAL RECORD NO.:  [id number]          PATIENT TYPE:  AMB   LOCATION:  SDC                           FACILITY:  [facility]   PHYSICIAN:  Dineen Kid. Rana Snare, M.D.    DATE OF BIRTH:  11/06/1976   DATE OF PROCEDURE:  04/23/2005  DATE OF DISCHARGE:                                 OPERATIVE REPORT   PREOPERATIVE DIAGNOSIS:  Abnormal uterine bleeding and endometrial mass  versus synechiae on sonohystogram.   POSTOPERATIVE DIAGNOSIS:  Abnormal uterine bleeding, and uterine synechiae  with polyp.   PROCEDURE:  Hysteroscopy, D&C.   SURGEON:  Dineen Kid. Rana Snare, M.D.   ANESTHESIA:  Monitored anesthetic care and paracervical block.   INDICATIONS:  [name] is a 36 year old G1 P1, irregular bleeding over the  last number of months, not better with oral contraceptive agents.  Underwent  a sonohystogram showing a small polyp or synechiae within the endometrial  cavity.  She presents for definitive surgical intervention and evaluation of  this.  Risks and benefits were discussed.  Informed consent was obtained.   FINDINGS AT THE TIME OF SURGERY:  Several small uterine synechiae and  polypoid tissue surrounding the synechiae.  Otherwise, normal-appearing  ostia and cervix.   DESCRIPTION OF PROCEDURE:  After adequate analgesia, the patient was placed  in the dorsal lithotomy position.  She was sterilely prepped and draped.  The bladder was sterilely drained.  A Graves speculum was placed.  A  tenaculum was placed on the anterior lip of the cervix, and paracervical  block was placed with 1% Xylocaine with 1:100,000 epinephrine, a total of 20  cc used.  Uterus was sounded to 8 cm and was easily dilated up to a #27  Pratt dilator.  The hysteroscope was inserted.  The above findings were  noted.  Curettage was performed, with care taken to remove the uterine  synechiae and the areas of polyps, with a gritty surface felt throughout the  endometrial  cavity.  On a second look with a hysteroscope, all evidence of  uterine synechiae was removed as well as polypoid tissue around the  synechiae, and at this time had a normal-appearing endometrial cavity,  normal-appearing ostia and cervix.  The hysteroscope was then removed.  Tenaculum was removed from the anterior lip of the cervix.  It was noted to  be hemostatic.  Sorbitol deficit was 50 cc.  The patient received 30 mg of  Toradol postoperatively, 1 g of Rocephin intraoperatively.  Sponge, needle,  and instrument count was normal x 3.   DISPOSITION:  The patient will be discharged home.  Will follow up in the  office in two to three weeks.  Sent home with a routine instruction sheet  for D&C.  Told to return for increased pain, fever, or bleeding.       DCL/MEDQ  D:  04/23/2005  T:  04/23/2005  Job:  [id number]

## 2011-04-03 NOTE — H&P (Signed)
[facility]  Patient:    [name], [name]                       MRN: [id number] Adm. Date:  [id number] Attending:  Trevor Iha                         History and Physical  HISTORY OF PRESENT ILLNESS:   Ms. Guion is a 35 year old, G1, P37, status post vaginal delivery on Mar 20, 2001.  The delivery was uncomplicated.  The patient continued to have abnormal bleeding despite oral contraceptive agents with bleeding four to five pads a day.  An ultrasound was performed on May 11, 2001, which showed 13 mm thickness in the endometrial area consistent with retained products of contraception.  The patient presents today for dilatation and evacuation.  PAST MEDICAL HISTORY:         She has a history of kidney stones.  PAST GYNECOLOGICAL HISTORY:   History of abnormal Pap smear and LEEP.  MEDICATIONS:                  Ortho Tri-Cyclen.  ALLERGIES:                    She has no known drug allergies.  PHYSICAL EXAMINATION:         The blood pressure is 112/68.  The hemoglobin is 12.8.  HEART:                        Regular rate and rhythm.  LUNGS:                        Clear to auscultation bilaterally.  ABDOMEN:                      Nondistended and nontender.  The uterus is anteverted, mobile, and nontender.  IMPRESSION:                   Abnormal uterine bleeding.  Ultrasound consistent with retained products of conception.  PLAN:                         Dilatation and evacuation.  The risks and benefits were discussed at length and informed consent was obtained. DD:  05/27/01 TD:  05/27/01 Job: [id number]

## 2011-04-03 NOTE — Op Note (Signed)
[facility]  Patient:    [name], [name]                       MRN: [id number] Proc. Date: 05/27/01 Attending:  Trevor Iha, M.D.                           Operative Report  PREOPERATIVE DIAGNOSES:       1. Abnormal uterine bleeding.                               2. Probable retained products of conception by                                  ultrasound.  POSTOPERATIVE DIAGNOSES:      1. Abnormal uterine bleeding.                               2. Probable retained products of conception by                                  ultrasound.  PROCEDURE:                    Dilation and evacuation.  SURGEON:                      Trevor Iha, M.D.  ANESTHESIA:                   General.  ESTIMATED BLOOD LOSS:         20 cc.  INDICATIONS:                  Ms. Rau is a 35 year old, G1, P1, approximately two months out from vaginal delivery, which was uncomplicated, who has had persistent abnormal uterine bleeding which facilitated an ultrasound which was suggestive of retained products of conception. She presents today for dilation and evacuation. Risk and benefits were discussed at length. Informed consent was obtained.  DESCRIPTION OF PROCEDURE:     After adequate analgesia, the patient was placed in the dorsal lithotomy position. She was sterilely prepped and draped. The bladder was sterilely drained. Graves speculum was placed. A tenaculum was placed on the anterior lip of the cervix. The uterus was sounded to 8 cm and was easily dilated to a #27 Hegar dilator. Gentle sharp curettage was performed retrieving stringy material consistent with fetal membranes. It was followed by a suction curet #7 mm in size retrieving at this time mostly clot, and gentle sharp curettage following this. The patient was given Methergine 0.2 mg IM. A gritty surface was felt throughout the endometrial cavity and did not appear to have any more retained  products. At this time, the curet was removed. The tenaculum was removed from the anterior lip of the cervix. Direct pressure was applied until hemostasis was achieved. The speculum was then removed. The patient tolerated the procedure well, was stable, and transferred to recovery room. The estimated blood loss during the procedure was 20 cc.  DISPOSITION:                  The patient will be  discharged to home with a followup in the office in two to three weeks. She was given the routine instruction sheet for D&C and a prescription for doxycycline 100 mg p.o. b.i.d. x 7 days. DD:  05/27/01 TD:  05/27/01 Job: [id number]

## 2011-04-03 NOTE — H&P (Signed)
NAMEFANTASIA, JINKINS              ACCOUNT NO.:  [id number]   MEDICAL RECORD NO.:  [id number]          PATIENT TYPE:  AMB   LOCATION:  SDC                           FACILITY:  [facility]   PHYSICIAN:  Dineen Kid. Rana Snare, M.D.    DATE OF BIRTH:  [birth year]   DATE OF ADMISSION:  04/23/2005  DATE OF DISCHARGE:                                HISTORY & PHYSICAL   HISTORY OF PRESENT ILLNESS:  Ms. Garretson is a 35 year old, G1, P1, with  problems with irregular bleeding over the last several months.  She is  currently off oral contraceptive agents because she is contemplating  pregnancy.  She is also having problems with dysmenorrhea and cramping, as  well.  She did require a D&C after delivery for retained products after  delivery in 1992.  Her periods did get better for a number of years, but  other the last six months they have become more irregular and painful.  She  underwent a sonohystogram on Mar 31, 2005 which shows in the right fundus a  small polyp or synechiae measuring 8 mm in size.  She presents for  definitive surgical intervention, evaluation, and treatment of this today.   PAST MEDICAL HISTORY:  She does have a history of kidney stones.   PAST GYNECOLOGICAL HISTORY:  She has had a history of abnormal Pap smear,  and required a D&C after retained products of conception.   MEDICATIONS:  Currently none.   ALLERGIES:  She has no known drug allergies.   PHYSICAL EXAMINATION:  VITAL SIGNS:  Blood pressure is 112/78, heart regular  rate and rhythm.  LUNGS:  Clear to auscultation bilaterally.  ABDOMEN:  Nondistended, nontender.  PELVIC:  Uterus is anteverted, mobile, and nontender.  Sonohystogram of the  uterus is 6.9 x 2.3 x 4.25.  Sonohystogram shows a polyp within the  endometrial cavity from the anterior surface.   IMPRESSION AND PLAN:  Abnormal uterine bleeding.  Endometrial mass  consistent with a polyp.   RECOMMENDATIONS:  Hysteroscopy D&C for evaluation and removal of this.  Risks  and benefits were discussed at length which include, but are not  limited to, risk of infection, bleeding, damage to the uterus, tubes,  ovaries, bowel, bladder, the possibility this may not alleviate the  bleeding, the possibility this may recur.  She does give her informed  consent and wishes to proceed.       DCL/MEDQ  D:  04/22/2005  T:  04/22/2005  Job:  [id number]

## 2011-04-03 NOTE — Op Note (Signed)
[name], SCHENDEL              ACCOUNT NO.:  [id number]   MEDICAL RECORD NO.:  [id number]          PATIENT TYPE:  AMB   LOCATION:  DAY                          FACILITY:  [facility]   PHYSICIAN:  Mark C. Vernie Ammons, M.D.  DATE OF BIRTH:  [birth year]   DATE OF PROCEDURE:  11/06/2004  DATE OF DISCHARGE:                                 OPERATIVE REPORT   PREOPERATIVE DIAGNOSES:  Left ureteral calculus.   POSTOPERATIVE DIAGNOSES:  Left ureteral calculus.   PROCEDURE:  Cystoscopy, left ureteroscopy and stone extraction.   SURGEON:  Mark C. Vernie Ammons, M.D.   ANESTHESIA:  General.   ESTIMATED BLOOD LOSS:  None.   DRAINS:  None.   SPECIMENS:  Stone given to patient.   COMPLICATIONS:  None.   INDICATIONS FOR PROCEDURE:  The patient is a 35 year old white female seen  in my office earlier today.  She had an episode of gross hematuria and some  abdominal cramping about a week and a half ago.  She then began to have some  left sided flank pain and was found to have a 5 mm left distal ureteral  calculus.  The stone was confirmed present in my office by KUB today and I  discussed the treatment options. She had elected to proceed with stone  extraction.   DESCRIPTION OF PROCEDURE:  After informed consent, the patient was brought  to the major OR, placed on the table, administered general anesthesia and  then moved to the dorsal lithotomy position.  Her genitalia was sterilely  prepped and draped. The 6 French rigid ureteroscope was then passed per  urethra into the bladder, the left orifice was identified and the  ureteroscope was then passed into the left orifice easily. I was able to  identify the stone.  The nitinol basket was used to engage the stone, grasp  it and it was extracted easily along with the scope. The patient was  awakened and taken to the recovery room in stable satisfactory condition.  She tolerated the procedure well with no intraoperative complications.   She has been  given a prescription for 76 Mepergan fortis and will return to  my office in approximately 14 days for followup.    Mark  MCO/MEDQ  D:  11/06/2004  T:  11/06/2004  Job:  [id number]

## 2011-08-13 LAB — CBC
HCT: 36
Hemoglobin: 10.4 — ABNORMAL LOW
Hemoglobin: 12.4
Platelets: 286
RBC: 3.25 — ABNORMAL LOW
RDW: 13.9
WBC: 14.1 — ABNORMAL HIGH
WBC: 14.6 — ABNORMAL HIGH

## 2011-08-26 LAB — ABO/RH: ABO/RH(D): O POS

## 2019-12-19 ENCOUNTER — Encounter: Payer: Self-pay | Admitting: Family Medicine

## 2019-12-19 ENCOUNTER — Ambulatory Visit: Payer: BC Managed Care – PPO | Admitting: Family Medicine

## 2019-12-19 ENCOUNTER — Other Ambulatory Visit: Payer: Self-pay

## 2019-12-19 VITALS — BP 122/80 | HR 89 | Temp 97.4°F | Resp 15 | Ht 66.0 in | Wt 193.1 lb

## 2019-12-19 DIAGNOSIS — Z720 Tobacco use: Secondary | ICD-10-CM | POA: Insufficient documentation

## 2019-12-19 DIAGNOSIS — E669 Obesity, unspecified: Secondary | ICD-10-CM | POA: Diagnosis not present

## 2019-12-19 DIAGNOSIS — R232 Flushing: Secondary | ICD-10-CM | POA: Diagnosis not present

## 2019-12-19 DIAGNOSIS — Z23 Encounter for immunization: Secondary | ICD-10-CM | POA: Diagnosis not present

## 2019-12-19 DIAGNOSIS — R7303 Prediabetes: Secondary | ICD-10-CM | POA: Insufficient documentation

## 2019-12-19 DIAGNOSIS — E663 Overweight: Secondary | ICD-10-CM | POA: Insufficient documentation

## 2019-12-19 NOTE — Assessment & Plan Note (Signed)
Asked about quitting: confirms they are currently smokes cigarettes Advise to quit smoking: Educated about QUITTING to reduce the risk of cancer, cardio and cerebrovascular disease. Assess willingness: Unwilling to quit at this time, but is working on cutting back. Assist with counseling and pharmacotherapy: Counseled for 5 minutes and literature provided. Arrange for follow up:  not quitting follow up in 3 months and continue to offer help.   

## 2019-12-19 NOTE — Progress Notes (Addendum)
Subjective:  Patient ID: [name], female    DOB: 11/05/1976  Age: 44 y.o. MRN: [id number]  CC:  Chief Complaint  Patient presents with  . New Patient (Initial Visit)    establish care      HPI  HPI Mrs Rathel is a 44 year old female patient who presents today to establish care.  Previously was seen at [facility] back in 2012-2013.  Reports that she does not have much of a history to report she has a family history of cardiovascular disease both parents have had to have stents placed.  Her mother was 48 at her for stent placement.  She is concerned a little bit about that situation.  She reports that she smokes a half a pack a day since she was 44 years old.  She is open to getting the flu vaccine is unsure when her last tetanus was.  Did have a hysterectomy unsure if she still has a cervix or not and needs a Pap.  She reports the last 3 years she is put on about 40 pounds.  She reports that she knows that she does not eat the best she eats a lot of home-cooked foods does eat her veggies but she does not cook the healthiest.  Updates she is taking doxycycline as she has adult acne at the time.  But otherwise not on any other medications. She reports that she is having hot flashes pretty regularly and would like to know if she is going through menopause or not.   Today patient denies signs and symptoms of COVID 19 infection including fever, chills, cough, shortness of breath, and headache. Past Medical, Surgical, Social History, Allergies, and Medications have been Reviewed.   History reviewed. No pertinent past medical history.  Current Meds  Medication Sig  . DOXYCYCLINE PO Take 1 capsule by mouth 2 (two) times daily.    ROS:  Review of Systems  Constitutional: Negative.   HENT: Negative.   Eyes: Negative.   Respiratory: Negative.   Cardiovascular: Negative.   Gastrointestinal: Negative.   Genitourinary: Negative.   Musculoskeletal: Negative.   Skin: Negative.    Neurological: Negative.   Endo/Heme/Allergies: Negative.   Psychiatric/Behavioral: Negative.   All other systems reviewed and are negative.    Objective:   Today's Vitals: BP 122/80   Pulse 89   Temp (!) 97.4 F (36.3 C) (Temporal)   Resp 15   Ht 5\' 6"  (1.676 m)   Wt 193 lb 1.9 oz (87.6 kg)   SpO2 98%   BMI 31.17 kg/m  Vitals with BMI 12/19/2019  Height 5\' 6"   Weight 193 lbs 2 oz  BMI 31.19  Systolic 122  Diastolic 80  Pulse 89     Physical Exam Vitals and nursing note reviewed.  Constitutional:      Appearance: Normal appearance. She is well-developed and well-groomed. She is obese.  HENT:     Head: Normocephalic and atraumatic.     Right Ear: External ear normal.     Left Ear: External ear normal.     Nose: Nose normal.     Mouth/Throat:     Mouth: Mucous membranes are moist.     Pharynx: Oropharynx is clear.  Eyes:     General:        Right eye: No discharge.        Left eye: No discharge.     Conjunctiva/sclera: Conjunctivae normal.  Cardiovascular:     Rate and Rhythm: Normal rate  and regular rhythm.     Pulses: Normal pulses.     Heart sounds: Normal heart sounds.  Pulmonary:     Effort: Pulmonary effort is normal.     Breath sounds: Normal breath sounds.  Musculoskeletal:        General: Normal range of motion.     Cervical back: Normal range of motion and neck supple.  Skin:    General: Skin is warm.  Neurological:     General: No focal deficit present.     Mental Status: She is alert and oriented to person, place, and time.  Psychiatric:        Attention and Perception: Attention normal.        Mood and Affect: Mood normal.        Speech: Speech normal.        Behavior: Behavior normal. Behavior is cooperative.        Thought Content: Thought content normal.        Cognition and Memory: Cognition normal.        Judgment: Judgment normal.     Assessment   1. Nicotine abuse   2. Obesity (BMI 30.0-34.9)   3. Prediabetes   4. Hot  flashes   5. Encounter for vaccination     Tests ordered Orders Placed This Encounter  Procedures  . CBC  . COMPLETE METABOLIC PANEL WITH GFR  . Hemoglobin A1c  . Lipid panel  . [place]     Plan: Please see assessment and plan per problem list above.   No orders of the defined types were placed in this encounter.   Patient to follow-up in annual in 5-6 months.  Perlie Mayo, NP

## 2019-12-19 NOTE — Assessment & Plan Note (Signed)
Has had a hysterectomy is unsure if she still has a cervix.   We will check [facility] to see if she is going through menopause or not.  Will provide her with treatment if hot flashes get to be unbearable.

## 2019-12-19 NOTE — Assessment & Plan Note (Signed)
[name] is educated about the importance of exercise daily to help with weight management. A minumum of 30 minutes daily is recommended. Additionally, importance of healthy food choices  with portion control discussed. Wt Readings from Last 3 Encounters:  12/19/19 193 lb 1.9 oz (87.6 kg)

## 2019-12-19 NOTE — Patient Instructions (Addendum)
Happy New Year! May you have a year filled with hope, love, happiness and laughter.  I appreciate the opportunity to provide you with care for your health and wellness. Today we discussed: establish care  Follow up: 5-6 months for annual no pap  Labs placed today- please get fasting (nothing by mouth for 8 hours prior)  Please continue to practice social distancing to keep you, your family, and our community safe.  If you must go out, please wear a mask and practice good handwashing.  It was a pleasure to see you and I look forward to continuing to work together on your health and well-being. Please do not hesitate to call the office if you need care or have questions about your care.  Have a wonderful day and week. With Gratitude, Tereasa Coop, DNP, AGNP-BC   Please think about quitting smoking.  This is very important for your health.  Consider setting a quit date, then cutting back or switching brands to prepare to stop.  Also think of the money you will save every day by not smoking.  Quick Tips to Quit Smoking: . Fix a date i.e. keep a date in mind from when you would not touch a tobacco product to smoke  . Keep yourself busy and block your mind with work loads or reading books or watching movies in malls where smoking is not allowed  . Vanish off the things which reminds you about smoking for example match box, or your favorite lighter, or the pipe you used for smoking, or your favorite jeans and shirt with which you used to enjoy smoking, or the club where you used to do smoking  . Try to avoid certain people places and incidences where and with whom smoking is a common factor to add on  . Praise yourself with some token gifts from the money you saved by stopping smoking  . Anti Smoking teams are there to help you. Join their programs  . Anti-smoking Gums are there in many medical shops. Try them to quit smoking   Side-effects of Smoking: . Disease caused by smoking cigarettes  are emphysema, bronchitis, heart failures  . Premature death  . Cancer is the major side effect of smoking  . Heart attacks and strokes are the quick effects of smoking causing sudden death  . Some smokers lives end up with limbs amputated  . Breathing problem or fast breathing is another side effect of smoking  . Due to more intakes of smokes, carbon mono-oxide goes into your brain and other muscles of the body which leads to swelling of the veins and blockage to the air passage to lungs  . Carbon monoxide blocks blood vessels which leads to blockage in the flow of blood to different major body organs like heart lungs and thus leads to attacks and deaths  . During pregnancy smoking is very harmful and leads to premature birth of the infant, spontaneous abortions, low weight of the infant during birth  . Fat depositions to narrow and blocked blood vessels causing heart attacks  . In many cases cigarette smoking caused infertility in men

## 2019-12-19 NOTE — Assessment & Plan Note (Signed)
Will be getting updated labs.  We will treat appropriately.  Advised for her to start eating a more well-balanced diet at this time.

## 2019-12-19 NOTE — Assessment & Plan Note (Signed)

## 2019-12-21 ENCOUNTER — Other Ambulatory Visit: Payer: Self-pay

## 2019-12-21 DIAGNOSIS — E785 Hyperlipidemia, unspecified: Secondary | ICD-10-CM

## 2019-12-21 LAB — CBC
HCT: 44.3 % (ref 35.0–45.0)
Hemoglobin: 15.3 g/dL (ref 11.7–15.5)
MCH: 32.1 pg (ref 27.0–33.0)
MCHC: 34.5 g/dL (ref 32.0–36.0)
MCV: 93.1 fL (ref 80.0–100.0)
MPV: 10.3 fL (ref 7.5–12.5)
Platelets: 405 10*3/uL — ABNORMAL HIGH (ref 140–400)
RBC: 4.76 10*6/uL (ref 3.80–5.10)
RDW: 12.4 % (ref 11.0–15.0)
WBC: 12.2 10*3/uL — ABNORMAL HIGH (ref 3.8–10.8)

## 2019-12-21 LAB — COMPLETE METABOLIC PANEL WITH GFR
AG Ratio: 1.8 (calc) (ref 1.0–2.5)
ALT: 16 U/L (ref 6–29)
AST: 15 U/L (ref 10–30)
Albumin: 4.2 g/dL (ref 3.6–5.1)
Alkaline phosphatase (APISO): 62 U/L (ref 31–125)
BUN: 14 mg/dL (ref 7–25)
CO2: 25 mmol/L (ref 20–32)
Calcium: 9.8 mg/dL (ref 8.6–10.2)
Chloride: 105 mmol/L (ref 98–110)
Creat: 0.67 mg/dL (ref 0.50–1.10)
GFR, Est African American: 125 mL/min/{1.73_m2} (ref >=60)
GFR, Est Non African American: 108 mL/min/{1.73_m2} (ref >=60)
Globulin: 2.4 g/dL (calc) (ref 1.9–3.7)
Glucose, Bld: 85 mg/dL (ref 65–99)
Potassium: 4.9 mmol/L (ref 3.5–5.3)
Sodium: 140 mmol/L (ref 135–146)
Total Bilirubin: 0.4 mg/dL (ref 0.2–1.2)
Total Protein: 6.6 g/dL (ref 6.1–8.1)

## 2019-12-21 LAB — HEMOGLOBIN A1C
Hgb A1c MFr Bld: 5.4 % of total Hgb (ref <5.7)
Mean Plasma Glucose: 108 (calc)
eAG (mmol/L): 6 (calc)

## 2019-12-21 LAB — FOLLICLE STIMULATING HORMONE: FSH: 88.4 m[IU]/mL

## 2019-12-21 LAB — LIPID PANEL
Cholesterol: 231 mg/dL — ABNORMAL HIGH (ref <200)
HDL: 54 mg/dL (ref >=50)
LDL Cholesterol (Calc): 142 mg/dL (calc) — ABNORMAL HIGH
Non-HDL Cholesterol (Calc): 177 mg/dL (calc) — ABNORMAL HIGH (ref <130)
Total CHOL/HDL Ratio: 4.3 (calc) (ref <5.0)
Triglycerides: 210 mg/dL — ABNORMAL HIGH (ref <150)

## 2020-03-19 ENCOUNTER — Encounter: Payer: Self-pay | Admitting: Family Medicine

## 2020-03-19 ENCOUNTER — Ambulatory Visit: Payer: BC Managed Care – PPO | Admitting: Family Medicine

## 2020-03-19 ENCOUNTER — Other Ambulatory Visit: Payer: Self-pay

## 2020-03-19 VITALS — BP 104/70 | HR 86 | Temp 97.7°F | Resp 15 | Ht 66.0 in | Wt 184.0 lb

## 2020-03-19 DIAGNOSIS — Z6829 Body mass index (BMI) 29.0-29.9, adult: Secondary | ICD-10-CM | POA: Diagnosis not present

## 2020-03-19 DIAGNOSIS — R7303 Prediabetes: Secondary | ICD-10-CM

## 2020-03-19 DIAGNOSIS — E785 Hyperlipidemia, unspecified: Secondary | ICD-10-CM

## 2020-03-19 DIAGNOSIS — E663 Overweight: Secondary | ICD-10-CM | POA: Diagnosis not present

## 2020-03-19 NOTE — Patient Instructions (Addendum)
I appreciate the opportunity to provide you with care for your health and wellness. Today we discussed: labs   Follow up: 6 months for annual -fasting morning appt if able  Labs next week-fasting No referrals today  Keep up the great work on weight loss!!! :)  Please continue to practice social distancing to keep you, your family, and our community safe.  If you must go out, please wear a mask and practice good handwashing.  It was a pleasure to see you and I look forward to continuing to work together on your health and well-being. Please do not hesitate to call the office if you need care or have questions about your care.  Have a wonderful day and week. With Gratitude, Tereasa Coop, DNP, AGNP-BC

## 2020-03-19 NOTE — Progress Notes (Signed)
Subjective:  Patient ID: [name], female    DOB: Jun 23, 1976  Age: 44 y.o. MRN: [id number]  CC:  Chief Complaint  Patient presents with  . Obesity    follow up 3 month visit   . lab work      HPI  HPI [name] is a 44 year old female patient of mine.  Recently established this year.  Here today for follow-up on weight and lab work.  History includes nicotine abuse, obesity, prediabetic, hyperlipidemia.  Over the last several months she has been working on her diet and needs to get updated labs.  She reports she has been doing a new program to help with weight loss which includes eating different snacks and few bars that are related to weight loss and then eating a lean green dinner.  She reports she has been doing this now for about a month for sure.  She has lost about 10 pounds since her last visit as well.  She feels like she has improved and is feeling better.  Reports increased water intake.  She reports that she is sleeping well.  Denies having any headaches, vision changes, shortness of breath, chest pain, palpitations, leg swelling, cough, fever, chills, headache or any other signs symptoms of infection or uncontrolled blood pressure.   Today patient denies signs and symptoms of COVID 19 infection including fever, chills, cough, shortness of breath, and headache. Past Medical, Surgical, Social History, Allergies, and Medications have been Reviewed.   History reviewed. No pertinent past medical history.  No outpatient medications have been marked as taking for the 03/19/20 encounter (Office Visit) with Perlie Mayo, NP.    ROS:  Review of Systems  Constitutional: Negative.   HENT: Negative.   Eyes: Negative.   Respiratory: Negative.   Cardiovascular: Negative.   Gastrointestinal: Negative.   Genitourinary: Negative.   Musculoskeletal: Negative.   Skin: Negative.   Neurological: Negative.   Endo/Heme/Allergies: Negative.   Psychiatric/Behavioral:  Negative.   All other systems reviewed and are negative.    Objective:   Today's Vitals: BP 104/70   Pulse 86   Temp 97.7 F (36.5 C)   Resp 15   Ht 5\' 6"  (1.676 m)   Wt 184 lb (83.5 kg)   SpO2 98%   BMI 29.70 kg/m  Vitals with BMI 03/19/2020 12/19/2019  Height 5\' 6"  5\' 6"   Weight 184 lbs 193 lbs 2 oz  BMI 79.89 21.19  Systolic 417 408  Diastolic 70 80  Pulse 86 89     Physical Exam Vitals and nursing note reviewed.  Constitutional:      Appearance: Normal appearance. She is well-developed, well-groomed and overweight.  HENT:     Head: Normocephalic and atraumatic.     Right Ear: External ear normal.     Left Ear: External ear normal.     Mouth/Throat:     Comments: Mask in place  Eyes:     General:        Right eye: No discharge.        Left eye: No discharge.     Conjunctiva/sclera: Conjunctivae normal.  Cardiovascular:     Rate and Rhythm: Normal rate and regular rhythm.     Pulses: Normal pulses.     Heart sounds: Normal heart sounds.  Pulmonary:     Effort: Pulmonary effort is normal.     Breath sounds: Normal breath sounds.  Musculoskeletal:        General: Normal range  of motion.     Cervical back: Normal range of motion and neck supple.  Skin:    General: Skin is warm.  Neurological:     General: No focal deficit present.     Mental Status: She is alert and oriented to person, place, and time.  Psychiatric:        Attention and Perception: Attention normal.        Mood and Affect: Mood normal.        Speech: Speech normal.        Behavior: Behavior normal. Behavior is cooperative.        Thought Content: Thought content normal.        Cognition and Memory: Cognition normal.        Judgment: Judgment normal.    Assessment   1. Hyperlipidemia, unspecified hyperlipidemia type   2. Overweight with body mass index (BMI) of 29 to 29.9 in adult   3. Prediabetes     Tests ordered Orders Placed This Encounter  Procedures  . CBC  . COMPLETE  METABOLIC PANEL WITH GFR  . Lipid panel     Plan: Please see assessment and plan per problem list above.   No orders of the defined types were placed in this encounter.   Patient to follow-up in 6 months for annual.  Freddy Finner, NP

## 2020-03-19 NOTE — Assessment & Plan Note (Signed)
Improved, advised to continue doing the same items that she is doing and seeing lifestyle changes.  We will get updated labs as well.  To make sure her cholesterol is not being infected negatively.

## 2020-03-19 NOTE — Assessment & Plan Note (Signed)
Lipid Panel     Component Value Date/Time   CHOL 231 (H) 12/20/2019 0839   TRIG 210 (H) 12/20/2019 0839   HDL 54 12/20/2019 0839   CHOLHDL 4.3 12/20/2019 0839   LDLCALC 142 (H) 12/20/2019 0839    She is encouraged to continue lifestyle changes.  Will get updated labs.  Pending labs will decide if medication is of need or if lifestyle changes have dropped the levels then we will continue lifestyle changes for another 6 months and reevaluate.

## 2020-04-04 LAB — LIPID PANEL
Cholesterol: 203 mg/dL — ABNORMAL HIGH (ref <200)
HDL: 43 mg/dL — ABNORMAL LOW (ref >=50)
LDL Cholesterol (Calc): 142 mg/dL (calc) — ABNORMAL HIGH
Non-HDL Cholesterol (Calc): 160 mg/dL (calc) — ABNORMAL HIGH (ref <130)
Total CHOL/HDL Ratio: 4.7 (calc) (ref <5.0)
Triglycerides: 75 mg/dL (ref <150)

## 2020-04-04 LAB — CBC
HCT: 47.8 % — ABNORMAL HIGH (ref 35.0–45.0)
Hemoglobin: 16 g/dL — ABNORMAL HIGH (ref 11.7–15.5)
MCH: 31.4 pg (ref 27.0–33.0)
MCHC: 33.5 g/dL (ref 32.0–36.0)
MCV: 93.7 fL (ref 80.0–100.0)
MPV: 11.6 fL (ref 7.5–12.5)
Platelets: 366 10*3/uL (ref 140–400)
RBC: 5.1 10*6/uL (ref 3.80–5.10)
RDW: 12.7 % (ref 11.0–15.0)
WBC: 8.9 10*3/uL (ref 3.8–10.8)

## 2020-04-04 LAB — COMPLETE METABOLIC PANEL WITH GFR
AG Ratio: 2 (calc) (ref 1.0–2.5)
ALT: 20 U/L (ref 6–29)
AST: 21 U/L (ref 10–30)
Albumin: 4.1 g/dL (ref 3.6–5.1)
Alkaline phosphatase (APISO): 65 U/L (ref 31–125)
BUN: 18 mg/dL (ref 7–25)
CO2: 26 mmol/L (ref 20–32)
Calcium: 9.5 mg/dL (ref 8.6–10.2)
Chloride: 106 mmol/L (ref 98–110)
Creat: 0.66 mg/dL (ref 0.50–1.10)
GFR, Est African American: 125 mL/min/{1.73_m2} (ref >=60)
GFR, Est Non African American: 108 mL/min/{1.73_m2} (ref >=60)
Globulin: 2.1 g/dL (calc) (ref 1.9–3.7)
Glucose, Bld: 85 mg/dL (ref 65–99)
Potassium: 4.4 mmol/L (ref 3.5–5.3)
Sodium: 140 mmol/L (ref 135–146)
Total Bilirubin: 0.4 mg/dL (ref 0.2–1.2)
Total Protein: 6.2 g/dL (ref 6.1–8.1)

## 2020-06-11 ENCOUNTER — Encounter: Payer: BC Managed Care – PPO | Admitting: Family Medicine

## 2020-08-15 ENCOUNTER — Other Ambulatory Visit: Payer: BC Managed Care – PPO

## 2020-09-18 ENCOUNTER — Encounter: Payer: BC Managed Care – PPO | Admitting: Family Medicine

## 2020-09-20 ENCOUNTER — Encounter: Payer: Self-pay | Admitting: Family Medicine

## 2020-09-25 ENCOUNTER — Ambulatory Visit (INDEPENDENT_AMBULATORY_CARE_PROVIDER_SITE_OTHER): Payer: BC Managed Care – PPO | Admitting: Family Medicine

## 2020-09-25 ENCOUNTER — Encounter: Payer: Self-pay | Admitting: Family Medicine

## 2020-09-25 ENCOUNTER — Other Ambulatory Visit: Payer: Self-pay

## 2020-09-25 VITALS — BP 126/76 | HR 90 | Temp 97.7°F | Ht 66.0 in | Wt 145.0 lb

## 2020-09-25 DIAGNOSIS — Z720 Tobacco use: Secondary | ICD-10-CM | POA: Diagnosis not present

## 2020-09-25 DIAGNOSIS — R7303 Prediabetes: Secondary | ICD-10-CM

## 2020-09-25 DIAGNOSIS — E785 Hyperlipidemia, unspecified: Secondary | ICD-10-CM | POA: Diagnosis not present

## 2020-09-25 DIAGNOSIS — Z0001 Encounter for general adult medical examination with abnormal findings: Secondary | ICD-10-CM | POA: Diagnosis not present

## 2020-09-25 DIAGNOSIS — Z1231 Encounter for screening mammogram for malignant neoplasm of breast: Secondary | ICD-10-CM

## 2020-09-25 DIAGNOSIS — Z8742 Personal history of other diseases of the female genital tract: Secondary | ICD-10-CM

## 2020-09-25 NOTE — Progress Notes (Signed)
Health Maintenance reviewed -   Immunization History  Administered Date(s) Administered  . Influenza,inj,Quad PF,6+ Mos 12/19/2019, 08/29/2020   Last Pap smear: Partial hysterectomy  Last mammogram: Order today  Last colonoscopy: N/A Last DEXA: N/A  Dentist:  Twice yearly -  Ophtho: not seen one, but reports some blurred vision  Exercise: walks 3 times a week  Smoker: 1/2 pk day cigarettes  Alcohol Use: social   Other doctors caring for patient include:  Patient Care Team: Freddy Finner, NP as PCP - General (Family Medicine)  End of Life Discussion:  Patient does not have a living will and medical power of attorney  Subjective:   HPI  [name] is a 44 y.o. female who presents for annual comprehensive visit and follow-up on chronic medical conditions.  She has the following concerns: None at this time.  Of note she has done very well on a diet since we saw her earlier in the year.  She has lost about 40 pounds.  She reports doing the Optimiva diet and overall did really well with this.  She denies having any changes in sleeping.  She denies having trouble chewing or swallowing.  She denies any changes in bowel or bladder habits.  No blood in urine or stool.  She denies have any memory issues.  She denies having any falls.  She denies having skin, hearing changes.  She does report sometimes some blurred vision but needs to see an eye doctor about this.  She reports that she had a colonoscopy before out of system it was normal. Needs mammogram.  Has had abnormal Pap smears.  Referral to GYN [facility] family provider.  Review Of Systems  Review of Systems  Constitutional: Negative.   HENT: Negative.   Eyes: Negative.   Respiratory: Negative.   Cardiovascular: Negative.   Gastrointestinal: Negative.   Endocrine: Negative.   Genitourinary: Negative.   Musculoskeletal: Negative.   Skin: Negative.   Neurological: Negative.   Psychiatric/Behavioral: Negative.      Objective:   PHYSICAL EXAM:  BP 126/76 (BP Location: Right Arm, Patient Position: Sitting, Cuff Size: Normal)   Pulse 90   Temp 97.7 F (36.5 C) (Temporal)   Ht 5\' 6"  (1.676 m)   Wt 145 lb (65.8 kg)   SpO2 98%   BMI 23.40 kg/m   Physical Exam Vitals and nursing note reviewed.  Constitutional:      Appearance: Normal appearance. She is normal weight.  HENT:     Head: Normocephalic.     Right Ear: Tympanic membrane normal.     Left Ear: Tympanic membrane normal.     Nose: Nose normal.     Mouth/Throat:     Mouth: Mucous membranes are moist.     Pharynx: Oropharynx is clear.  Eyes:     Extraocular Movements: Extraocular movements intact.     Conjunctiva/sclera: Conjunctivae normal.     Pupils: Pupils are equal, round, and reactive to light.  Cardiovascular:     Rate and Rhythm: Normal rate and regular rhythm.     Pulses: Normal pulses.          Radial pulses are 2+ on the right side and 2+ on the left side.       Dorsalis pedis pulses are 2+ on the right side and 2+ on the left side.     Heart sounds: Normal heart sounds.  Pulmonary:     Effort: Pulmonary effort is normal.  Breath sounds: Normal breath sounds.  Abdominal:     General: Abdomen is flat. Bowel sounds are normal.     Palpations: Abdomen is soft.  Musculoskeletal:        General: Normal range of motion.     Cervical back: Normal range of motion and neck supple.     Right lower leg: No edema.     Left lower leg: No edema.  Skin:    General: Skin is warm and dry.     Capillary Refill: Capillary refill takes less than 2 seconds.  Neurological:     General: No focal deficit present.     Mental Status: She is alert and oriented to person, place, and time. Mental status is at baseline.     Cranial Nerves: Cranial nerves are intact.     Sensory: Sensation is intact.     Motor: Motor function is intact.     Gait: Gait is intact.     Deep Tendon Reflexes: Reflexes are normal and symmetric.   Psychiatric:        Attention and Perception: Attention normal.        Mood and Affect: Mood normal.        Behavior: Behavior normal.        Thought Content: Thought content normal.        Judgment: Judgment normal.      Depression Screening  Depression screen [facility] 2/9 09/25/2020 09/25/2020 12/19/2019  Decreased Interest 0 0 0  Down, Depressed, Hopeless 0 0 0  PHQ - 2 Score 0 0 0     Falls  Fall Risk  09/25/2020 03/19/2020 12/19/2019  Falls in the past year? 0 0 0  Number falls in past yr: 0 0 0  Injury with Fall? 0 0 0  Risk for fall due to : No Fall Risks - -  Follow up Falls evaluation completed - -    Assessment & Plan:   1. Annual visit for general adult medical examination with abnormal findings   2. Hyperlipidemia, unspecified hyperlipidemia type   3. Prediabetes   4. Nicotine abuse   5. History of abnormal cervical Pap smear     Tests ordered Orders Placed This Encounter  Procedures  . CBC  . Comprehensive metabolic panel  . Hemoglobin A1c  . Lipid panel  . TSH  . Ambulatory referral to Obstetrics / Gynecology   Plan: Please see assessment and plan per problem list above.   No orders of the defined types were placed in this encounter.  I have personally reviewed: The patient's medical and social history Their use of alcohol, tobacco or illicit drugs Their current medications and supplements The patient's functional ability including ADLs,fall risks, home safety risks, cognitive, and hearing and visual impairment Diet and physical activities Evidence for depression or mood disorders  The patient's weight, height, BMI, and visual acuity have been recorded in the chart.  I have made referrals, counseling, and provided education to the patient based on review of the above and I have provided the patient with a written personalized care plan for preventive services.    Note: This dictation was prepared with Dragon dictation along with smaller phrase  technology. Similar sounding words can be transcribed inadequately or may not be corrected upon review. Any transcriptional errors that result from this process are unintentional.      Freddy Finner, NP   09/27/2020

## 2020-09-25 NOTE — Patient Instructions (Addendum)
  HAPPY FALL!  I appreciate the opportunity to provide you with care for your health and wellness. Today we discussed: Overall health  Follow up: 1 year for CPE, no Pap, fasting labs same day  Labs-fasting tomorrow Friday morning Referrals today to GYN in [facility] job on weight loss!  Continue to work on smoking less.   Have a great Holiday Season!   Please continue to practice social distancing to keep you, your family, and our community safe.  If you must go out, please wear a mask and practice good handwashing.  It was a pleasure to see you and I look forward to continuing to work together on your health and well-being. Please do not hesitate to call the office if you need care or have questions about your care.  Have a wonderful day and week. With Gratitude, Tereasa Coop, DNP, AGNP-BC  HEALTH MAINTENANCE RECOMMENDATIONS:  It is recommended that you get at least 30 minutes of aerobic exercise at least 5 days/week (for weight loss, you may need as much as 60-90 minutes). This can be any activity that gets your heart rate up. This can be divided in 10-15 minute intervals if needed, but try and build up your endurance at least once a week.  Weight bearing exercise is also recommended twice weekly.  Eat a healthy diet with lots of vegetables, fruits and fiber.  "Colorful" foods have a lot of vitamins (ie green vegetables, tomatoes, red peppers, etc).  Limit sweet tea, regular sodas and alcoholic beverages, all of which has a lot of calories and sugar.  Up to 1 alcoholic drink daily may be beneficial for women (unless trying to lose weight, watch sugars).  Drink a lot of water.  Calcium recommendations are 1200-1500 mg daily (1500 mg for postmenopausal women or women without ovaries), and vitamin D 1000 IU daily.  This should be obtained from diet and/or supplements (vitamins), and calcium should not be taken all at once, but in divided doses.  Monthly self breast exams and yearly  mammograms for women over the age of 77 is recommended.  Sunscreen of at least SPF 30 should be used on all sun-exposed parts of the skin when outside between the hours of 10 am and 4 pm (not just when at beach or pool, but even with exercise, golf, tennis, and yard work!)  Use a sunscreen that says "broad spectrum" so it covers both UVA and UVB rays, and make sure to reapply every 1-2 hours.  Remember to change the batteries in your smoke detectors when changing your clock times in the spring and fall.  Use your seat belt every time you are in a car, and please drive safely and not be distracted with cell phones and texting while driving.

## 2020-09-25 NOTE — Assessment & Plan Note (Signed)
Discussed monthly self breast exams and yearly mammograms; at least 30 minutes of aerobic activity at least 5 days/week and weight-bearing exercise 2x/week; proper sunscreen use reviewed; healthy diet, including goals of calcium and vitamin D intake and alcohol recommendations (less than or equal to 1 drink/day) reviewed; regular seatbelt use; changing batteries in smoke detectors.  Immunization recommendations discussed.  Colonoscopy recommendations reviewed.  

## 2020-09-27 DIAGNOSIS — Z8742 Personal history of other diseases of the female genital tract: Secondary | ICD-10-CM | POA: Insufficient documentation

## 2020-09-27 NOTE — Assessment & Plan Note (Addendum)
Referral made to OB/GYN not having any signs or symptoms.

## 2020-09-27 NOTE — Assessment & Plan Note (Signed)
Updated labs ordered.  Heart healthy diet encouraged.  We will treat cholesterol if needed.

## 2020-09-27 NOTE — Assessment & Plan Note (Signed)
Reports smoking about half a pack a day.  Asked about quitting: confirms they are currently smokes cigarettes Advise to quit smoking: Educated about QUITTING to reduce the risk of cancer, cardio and cerebrovascular disease. Assess willingness: Unwilling to quit at this time, but is working on cutting back. Assist with counseling and pharmacotherapy: Counseled for 5 minutes and literature provided. Arrange for follow up: not quitting follow up in 3 months and continue to offer help.

## 2020-09-27 NOTE — Assessment & Plan Note (Signed)
Updated labs ordered.

## 2020-09-28 LAB — COMPREHENSIVE METABOLIC PANEL
ALT: 20 IU/L (ref 0–32)
AST: 20 IU/L (ref 0–40)
Albumin/Globulin Ratio: 2 (ref 1.2–2.2)
Albumin: 4.3 g/dL (ref 3.8–4.8)
Alkaline Phosphatase: 84 IU/L (ref 44–121)
BUN/Creatinine Ratio: 16 (ref 9–23)
BUN: 10 mg/dL (ref 6–24)
Bilirubin Total: 0.4 mg/dL (ref 0.0–1.2)
CO2: 20 mmol/L (ref 20–29)
Calcium: 9.8 mg/dL (ref 8.7–10.2)
Chloride: 102 mmol/L (ref 96–106)
Creatinine, Ser: 0.63 mg/dL (ref 0.57–1.00)
GFR calc Af Amer: 126 mL/min/{1.73_m2} (ref >59)
GFR calc non Af Amer: 109 mL/min/{1.73_m2} (ref >59)
Globulin, Total: 2.2 g/dL (ref 1.5–4.5)
Glucose: 89 mg/dL (ref 65–99)
Potassium: 4.8 mmol/L (ref 3.5–5.2)
Sodium: 141 mmol/L (ref 134–144)
Total Protein: 6.5 g/dL (ref 6.0–8.5)

## 2020-09-28 LAB — LIPID PANEL
Chol/HDL Ratio: 3.7 ratio (ref 0.0–4.4)
Cholesterol, Total: 193 mg/dL (ref 100–199)
HDL: 52 mg/dL (ref >39)
LDL Chol Calc (NIH): 119 mg/dL — ABNORMAL HIGH (ref 0–99)
Triglycerides: 125 mg/dL (ref 0–149)
VLDL Cholesterol Cal: 22 mg/dL (ref 5–40)

## 2020-09-28 LAB — CBC
Hematocrit: 56.7 % — ABNORMAL HIGH (ref 34.0–46.6)
Hemoglobin: 19.6 g/dL — ABNORMAL HIGH (ref 11.1–15.9)
MCH: 31.8 pg (ref 26.6–33.0)
MCHC: 34.6 g/dL (ref 31.5–35.7)
MCV: 92 fL (ref 79–97)
Platelets: 322 10*3/uL (ref 150–450)
RBC: 6.16 x10E6/uL — ABNORMAL HIGH (ref 3.77–5.28)
RDW: 13.5 % (ref 11.7–15.4)
WBC: 8.6 10*3/uL (ref 3.4–10.8)

## 2020-09-28 LAB — HEMOGLOBIN A1C
Est. average glucose Bld gHb Est-mCnc: 117 mg/dL
Hgb A1c MFr Bld: 5.7 % — ABNORMAL HIGH (ref 4.8–5.6)

## 2020-09-28 LAB — TSH: TSH: 1.13 u[IU]/mL (ref 0.450–4.500)

## 2020-10-01 ENCOUNTER — Other Ambulatory Visit: Payer: Self-pay

## 2020-10-01 DIAGNOSIS — D582 Other hemoglobinopathies: Secondary | ICD-10-CM

## 2020-10-18 ENCOUNTER — Encounter: Payer: Self-pay | Admitting: Family Medicine

## 2020-10-18 ENCOUNTER — Encounter (HOSPITAL_COMMUNITY): Payer: Self-pay | Admitting: Hematology and Oncology

## 2020-10-21 DIAGNOSIS — D751 Secondary polycythemia: Secondary | ICD-10-CM | POA: Insufficient documentation

## 2020-10-21 NOTE — Assessment & Plan Note (Signed)
Hb 19.6  I discussed with the patient extensively the differential diagnosis of polycythemia 1. Primary polycythemia due to clonal stem cell abnormality 2. Secondary polycythemia due to cause that include hypoxia, heart or lung problems, altitude, athletics, erythropoietin producing lesion/tumors etc  Recommendation: 1. JAK-2 mutation testing to evaluate polycythemia vera 2. Erythropoietin level 3. Drink 8 glasses of water per day  Indications for phlebotomy 1. Primary polycythemia with hematocrit over 55 2. Secondary polycythemia with severe symptoms which include strokelike symptoms, severe recurrent headaches, severe fatigue.  Patient does not have any clear-cut symptoms that would require phlebotomy at this time. If the JAK-2 mutation is normal and erythropoietin level is normal, a bone marrow biopsy may be considered. If the erythropoietin is elevated, he will need ultrasound of the liver and kidney for further evaluation.   I provided patient written information on polycythemia.

## 2020-10-22 ENCOUNTER — Inpatient Hospital Stay (HOSPITAL_COMMUNITY): Payer: BC Managed Care – PPO

## 2020-10-22 ENCOUNTER — Other Ambulatory Visit: Payer: Self-pay

## 2020-10-22 ENCOUNTER — Inpatient Hospital Stay (HOSPITAL_COMMUNITY): Payer: BC Managed Care – PPO | Attending: Hematology and Oncology | Admitting: Hematology and Oncology

## 2020-10-22 DIAGNOSIS — D751 Secondary polycythemia: Secondary | ICD-10-CM | POA: Insufficient documentation

## 2020-10-22 LAB — CBC WITH DIFFERENTIAL/PLATELET
Abs Immature Granulocytes: 0.04 10*3/uL (ref 0.00–0.07)
Basophils Absolute: 0.1 10*3/uL (ref 0.0–0.1)
Basophils Relative: 1 %
Eosinophils Absolute: 0.3 10*3/uL (ref 0.0–0.5)
Eosinophils Relative: 3 %
HCT: 59.4 % — ABNORMAL HIGH (ref 36.0–46.0)
Hemoglobin: 19.7 g/dL — ABNORMAL HIGH (ref 12.0–15.0)
Immature Granulocytes: 0 %
Lymphocytes Relative: 40 %
Lymphs Abs: 4 10*3/uL (ref 0.7–4.0)
MCH: 32 pg (ref 26.0–34.0)
MCHC: 33.2 g/dL (ref 30.0–36.0)
MCV: 96.6 fL (ref 80.0–100.0)
Monocytes Absolute: 0.7 10*3/uL (ref 0.1–1.0)
Monocytes Relative: 7 %
Neutro Abs: 4.9 10*3/uL (ref 1.7–7.7)
Neutrophils Relative %: 49 %
Platelets: 268 10*3/uL (ref 150–400)
RBC: 6.15 MIL/uL — ABNORMAL HIGH (ref 3.87–5.11)
RDW: 16.8 % — ABNORMAL HIGH (ref 11.5–15.5)
WBC: 10 10*3/uL (ref 4.0–10.5)
nRBC: 0 % (ref 0.0–0.2)

## 2020-10-22 NOTE — Progress Notes (Signed)
[facility] CONSULT NOTE  Patient Care Team: Freddy Finner, NP as PCP - General (Family Medicine)  CHIEF COMPLAINTS/PURPOSE OF CONSULTATION:  Elevated hemoglobin  HISTORY OF PRESENTING ILLNESS:  [name] 44 y.o. female is here because of recent diagnosis of markedly elevated hemoglobin of 19.6.  Patient had been in excellent health without any major illnesses or health issues.  She is a current smoker.  She has lost 45 pounds since April 2021.  This is by following a very strict diet protocol.  She denies any problems with obstructive sleep apnea.  Denies any chest pain or shortness of breath or dizziness or lightheadedness.  She does feel occasionally fatigue but overall no major complaints.  On her routine blood work she was noted to have a hemoglobin of 19.6.  I reviewed her records extensively and collaborated the history with the patient.  SUMMARY OF ONCOLOGIC HISTORY: Oncology History   No history exists.     MEDICAL HISTORY:  No past medical history on file.  SURGICAL HISTORY: Past Surgical History:  Procedure Laterality Date  . ABDOMINAL HYSTERECTOMY    . KIDNEY STONE SURGERY      SOCIAL HISTORY: Social History   Socioeconomic History  . Marital status: Married    Spouse name: Fayrene Fearing   . Number of children: 2  . Years of education: Not on file  . 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 Smoking status: Current Every Day Smoker    Packs/day: 0.50    Years: 28.00    Pack years: 14.00    Types: Cigarettes  . Smokeless tobacco: Never Used  Substance and Sexual Activity  . Alcohol use: Yes    Comment: wine or beer 5 times a week   . Drug use: Never  . Sexual activity: Yes    Birth control/protection: Surgical  Other Topics Concern  . Not on file  Social History Narrative   Lives with Fayrene Fearing husband of 21 years    18-Payten    11-Jaxen       Dog- lab/beagle       Enjoys: spending time with family, fishing        Diet: eats all food groups veggies, cooks with butters and oils   Caffeine: 1 cup coffee in morning, sundrop lunch- 2-3 cups daily   Water: 1 bottle daily       Wears seat belt    Smoke and carbon monoxide detectors   Does not use phone while driving    Social Determinants of Health   Financial Resource Strain: Low Risk   . Difficulty of Paying Living Expenses: Not hard at all  Food Insecurity: No Food Insecurity  . Worried About Programme researcher, broadcasting/film/video in the Last Year: Never true  . Ran Out of Food in the Last Year: Never true  Transportation Needs: No Transportation Needs  . Lack of Transportation (Medical): No  . Lack of Transportation (Non-Medical): No  Physical Activity: Insufficiently Active  . Days of Exercise per Week: 3 days  . Minutes of Exercise per Session: 40 min  Stress: No Stress Concern Present  . Feeling of Stress : Not at all  Social Connections: Moderately Integrated  . Frequency of Communication with Friends and Family: More than three times a week  . Frequency of Social Gatherings with Friends and Family: More than three  times a week  . Attends Religious Services: More than 4 times per year  . Active Member of Clubs or Organizations: No  . Attends Banker Meetings: Never  . Marital Status: Married  Catering manager Violence: Not At Risk  . Fear of Current or Ex-Partner: No  . Emotionally Abused: No  . Physically Abused: No  . Sexually Abused: No    FAMILY HISTORY: Family History  Problem Relation Age of Onset  . Heart disease Mother   . Heart disease Father     ALLERGIES:  has No Known Allergies.  MEDICATIONS:  Current Outpatient Medications  Medication Sig Dispense Refill  . doxycycline (VIBRAMYCIN) 100 MG capsule Take 100 mg by mouth 2 (two) times daily.     No current facility-administered medications for this visit.     REVIEW OF SYSTEMS:   Constitutional: Denies fevers, chills or abnormal night sweats Eyes: Denies blurriness of vision, double vision or watery eyes Ears, nose, mouth, throat, and face: Denies mucositis or sore throat Respiratory: Denies cough, dyspnea or wheezes Cardiovascular: Denies palpitation, chest discomfort or lower extremity swelling Gastrointestinal:  Denies nausea, heartburn or change in bowel habits Skin: Denies abnormal skin rashes Lymphatics: Denies new lymphadenopathy or easy bruising Neurological:Denies numbness, tingling or new weaknesses Behavioral/Psych: Mood is stable, no new changes    All other systems were reviewed with the patient and are negative.  PHYSICAL EXAMINATION: ECOG PERFORMANCE STATUS: 1 - Symptomatic but completely ambulatory  Vitals:   10/22/20 1126  BP: 124/87  Pulse: 83  Resp: 18  Temp: (!) 97 F (36.1 C)  SpO2: 96%   Filed Weights   10/22/20 1126  Weight: 141 lb (64 kg)    GENERAL:alert, no distress and comfortable SKIN: skin color, texture, turgor are normal, no rashes or significant lesions EYES: normal, conjunctiva are pink and non-injected, sclera clear OROPHARYNX:no exudate, no erythema and lips, buccal mucosa, and tongue normal  NECK: supple, thyroid normal size, non-tender, without nodularity LYMPH:  no palpable lymphadenopathy in the cervical, axillary or inguinal LUNGS: clear to auscultation and percussion with normal breathing effort HEART: regular rate & rhythm and no murmurs and no lower extremity edema ABDOMEN:abdomen soft, non-tender and normal bowel sounds Musculoskeletal:no cyanosis of digits and no clubbing  PSYCH: alert & oriented x 3 with fluent speech NEURO: no focal motor/sensory deficits    LABORATORY DATA:  I have reviewed the data as listed Lab Results  Component Value Date   WBC 8.6 09/27/2020   HGB 19.6 (H) 09/27/2020   HCT 56.7 (H) 09/27/2020   MCV 92 09/27/2020   PLT 322 09/27/2020   Lab  Results  Component Value Date   NA 141 09/27/2020   K 4.8 09/27/2020   CL 102 09/27/2020   CO2 20 09/27/2020    RADIOGRAPHIC STUDIES: I have personally reviewed the radiological reports and agreed with the findings in the report.  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 Range 12/18/2010 13:34 12/26/2010 05:28 12/20/2019 08:39 04/04/2020 08:33 09/27/2020 10:24  Hemoglobin Latest Ref Range: 11.1 - 15.9 g/dL 95.6 9.7 (L) 21.3 08.6 (H) 19.6 (H)  HCT Latest Ref Range: 34.0 - 46.6 % 38.8 29.9 (L) 44.3 47.8 (H) 56.7 (H)   I discussed with the patient extensively the differential diagnosis of polycythemia 1. Primary polycythemia due to clonal stem cell abnormality 2. Secondary polycythemia due to cause that include hypoxia, heart or lung problems, altitude, athletics, erythropoietin producing lesion/tumors etc  Recommendation: 1. JAK-2 mutation testing to evaluate polycythemia vera 2. Erythropoietin level 3. Drink 8 glasses of water per day  Indications for phlebotomy 1. Primary polycythemia with hematocrit over 55 2. Secondary polycythemia with severe symptoms which include strokelike symptoms, severe recurrent headaches, severe fatigue.  I recommended the patient go to [facility] and donate a unit of blood.  If the JAK-2 mutation is normal and erythropoietin level is normal, secondary polycythemia is most likely etiology.  I instructed her to quit smoking.  Return to clinic in 2 weeks to review the results of JAK2 mutation testing and come up with the treatment plan.  All questions were answered. The patient knows to call the clinic with any problems, questions or concerns.    Tamsen Meek, MD 10/22/20

## 2020-10-22 NOTE — Telephone Encounter (Signed)
I am good with the referral to the office of her choice for GYN needs

## 2020-10-22 NOTE — Telephone Encounter (Signed)
Per up front staff, they called the [facility] GYN and they needed additional info so faxing that now.

## 2020-10-22 NOTE — Telephone Encounter (Signed)
Noted, thank you

## 2020-10-22 NOTE — Patient Instructions (Addendum)
[facility] at [facility] Discharge Instructions  You were seen today by Dr. Serena Croissant.  He is a Acupuncturist, or a physician who specializes in studying blood.  He talked with you about your history and the events that led to you being here today.  He talked with you today about having too much blood in your body.  The risks that come with that are possible blood clots and heart attacks.  Treatment for too much blood or polycythemia is to give blood.  You can do that here at the clinic or [facility]  They can only draw once every 2 months.  Our goal is to get your hemoglobin less than 18 or your hematocrit less than 45.    We will do additional lab work today.  We recommend you [facility] the [facility] and [facility] blood.  If they won't take your blood because of the antibiotics, please call us and we will set you up here for phlebotomy.  We will see you back in about 2-3 weeks to review these results.     Thank you for choosing [facility] at [facility] to provide your oncology and hematology care.  To afford each patient quality time with our provider, please arrive at least 15 minutes before your scheduled appointment time.   If you have a lab appointment with the Cancer Center please come in thru the Main Entrance and check in at the main information desk.  You need to re-schedule your appointment should you arrive 10 or more minutes late.  We strive to give you quality time with our providers, and arriving late affects you and other patients whose appointments are after yours.  Also, if you no show three or more times for appointments you may be dismissed from the clinic at the providers discretion.     Again, thank you for choosing [facility].  Our hope is that these requests will decrease the amount of time that you wait before being seen by our physicians.        _____________________________________________________________  Should you have questions after your visit to [facility], please contact our office at [phone] and follow the prompts.  Our office hours are 8:00 a.m. and 4:30 p.m. Monday - Friday.  Please note that voicemails left after 4:00 p.m. may not be returned until the following business day.  We are closed weekends and major holidays.  You do have access to a nurse 24-7, just call the main number to the clinic [phone] and do not press any options, hold on the line and a nurse will answer the phone.    For prescription refill requests, have your pharmacy contact our office and allow 72 hours.    Due to Covid, you will need to wear a mask upon entering the hospital. If you do not have a mask, a mask will be given to you at the Main Entrance upon arrival. For doctor visits, patients may have 1 support person age 19 or older with them. For treatment visits, patients can not have anyone with them due to social distancing guidelines and our immunocompromised population.

## 2020-10-22 NOTE — Telephone Encounter (Signed)
Spoke with pt on the phone, gave her the number to call to get her mammogram set up. Also told her we would look into her gyn referral and call her back about that.

## 2020-10-23 LAB — ERYTHROPOIETIN: Erythropoietin: 4.7 m[IU]/mL (ref 2.6–18.5)

## 2020-11-07 LAB — CALR + JAK2 E12-15 + MPL (REFLEXED)

## 2020-11-07 LAB — JAK2 V617F, W REFLEX TO CALR/E12/MPL

## 2020-11-18 ENCOUNTER — Ambulatory Visit (HOSPITAL_COMMUNITY): Payer: BC Managed Care – PPO | Admitting: Hematology

## 2020-11-22 ENCOUNTER — Ambulatory Visit (HOSPITAL_COMMUNITY)
Admission: RE | Admit: 2020-11-22 | Discharge: 2020-11-22 | Disposition: A | Discharge status: Home / Self Care | Payer: BC Managed Care – PPO | Source: Ambulatory Visit | Attending: Family Medicine | Admitting: Family Medicine

## 2020-11-22 ENCOUNTER — Other Ambulatory Visit: Payer: Self-pay

## 2020-11-22 DIAGNOSIS — Z1231 Encounter for screening mammogram for malignant neoplasm of breast: Secondary | ICD-10-CM | POA: Insufficient documentation

## 2020-11-29 ENCOUNTER — Other Ambulatory Visit (HOSPITAL_COMMUNITY): Payer: Self-pay | Admitting: Family Medicine

## 2020-11-29 DIAGNOSIS — R928 Other abnormal and inconclusive findings on diagnostic imaging of breast: Secondary | ICD-10-CM

## 2020-12-17 ENCOUNTER — Ambulatory Visit (HOSPITAL_COMMUNITY)
Admission: RE | Admit: 2020-12-17 | Discharge: 2020-12-17 | Disposition: A | Discharge status: Home / Self Care | Payer: BC Managed Care – PPO | Source: Ambulatory Visit | Attending: Family Medicine | Admitting: Family Medicine

## 2020-12-17 ENCOUNTER — Other Ambulatory Visit: Payer: Self-pay

## 2020-12-17 ENCOUNTER — Ambulatory Visit (HOSPITAL_COMMUNITY): Payer: BC Managed Care – PPO | Admitting: Hematology

## 2020-12-17 ENCOUNTER — Inpatient Hospital Stay (HOSPITAL_COMMUNITY): Payer: BC Managed Care – PPO | Attending: Hematology and Oncology | Admitting: Oncology

## 2020-12-17 ENCOUNTER — Inpatient Hospital Stay (HOSPITAL_COMMUNITY): Payer: BC Managed Care – PPO

## 2020-12-17 VITALS — BP 117/86 | HR 79 | Temp 97.0°F | Resp 16 | Wt 142.9 lb

## 2020-12-17 DIAGNOSIS — D751 Secondary polycythemia: Secondary | ICD-10-CM | POA: Diagnosis present

## 2020-12-17 DIAGNOSIS — F17210 Nicotine dependence, cigarettes, uncomplicated: Secondary | ICD-10-CM | POA: Insufficient documentation

## 2020-12-17 DIAGNOSIS — R928 Other abnormal and inconclusive findings on diagnostic imaging of breast: Secondary | ICD-10-CM | POA: Diagnosis present

## 2020-12-17 LAB — CBC WITH DIFFERENTIAL/PLATELET
Abs Immature Granulocytes: 0.04 10*3/uL (ref 0.00–0.07)
Basophils Absolute: 0.1 10*3/uL (ref 0.0–0.1)
Basophils Relative: 1 %
Eosinophils Absolute: 0.3 10*3/uL (ref 0.0–0.5)
Eosinophils Relative: 3 %
HCT: 51 % — ABNORMAL HIGH (ref 36.0–46.0)
Hemoglobin: 17.2 g/dL — ABNORMAL HIGH (ref 12.0–15.0)
Immature Granulocytes: 0 %
Lymphocytes Relative: 35 %
Lymphs Abs: 3.6 10*3/uL (ref 0.7–4.0)
MCH: 33.1 pg (ref 26.0–34.0)
MCHC: 33.7 g/dL (ref 30.0–36.0)
MCV: 98.3 fL (ref 80.0–100.0)
Monocytes Absolute: 1 10*3/uL (ref 0.1–1.0)
Monocytes Relative: 9 %
Neutro Abs: 5.3 10*3/uL (ref 1.7–7.7)
Neutrophils Relative %: 52 %
Platelets: 327 10*3/uL (ref 150–400)
RBC: 5.19 MIL/uL — ABNORMAL HIGH (ref 3.87–5.11)
RDW: 14.5 % (ref 11.5–15.5)
WBC: 10.2 10*3/uL (ref 4.0–10.5)
nRBC: 0 % (ref 0.0–0.2)

## 2020-12-17 NOTE — Progress Notes (Signed)
[facility] CONSULT NOTE  Patient Care Team: Brittany Finner, NP as PCP - General (Family Medicine)  CHIEF COMPLAINTS/PURPOSE OF CONSULTATION:  Elevated hemoglobin  HISTORY OF PRESENTING ILLNESS:  [name] 45 y.o. female is here because of recent diagnosis of markedly elevated hemoglobin of 19.6.  She was last seen in clinic on 10/22/2020.  Mrs. [name] is a 45 year old female who is overall healthy who presented to our clinic for an elevated hemoglobin.  She was seen by Dr. Pamelia Hoit.  She is a current everyday smoker.  She denied any neurological symptoms.  Denied any sleep apnea, COPD or asthma.  Today, she presents to review lab work.  She feels overall the same from previous.  Has noted hot flashes 3 months ago.  She assumed she was going through menopause.  She is status hysterectomy with ovaries still intact.  She donated blood back in December to the [facility].  She did not note any difference in how she feels.  She denies any neurological complaints such as headaches, changes in her vision or strokelike symptoms.  She still smokes.   SUMMARY OF ONCOLOGIC HISTORY: Oncology History   No history exists.     MEDICAL HISTORY:  No past medical history on file.  SURGICAL HISTORY: Past Surgical History:  Procedure Laterality Date  . ABDOMINAL HYSTERECTOMY    . KIDNEY STONE SURGERY      SOCIAL HISTORY: Social History   Socioeconomic History  . Marital status: Married    Spouse name: Brittany Fearing   . Number of children: 2  . Years of education: Not on file  . 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 Smoking status: Current Every Day Smoker    Packs/day: 0.50    Years: 28.00    Pack years: 14.00    Types: Cigarettes  . Smokeless tobacco: Never Used  Substance and Sexual Activity  . Alcohol use: Yes    Comment: wine or beer  5 times a week   . Drug use: Never  . Sexual activity: Yes    Birth control/protection: Surgical  Other Topics Concern  . Not on file  Social History Narrative   Lives with Brittany Fearing husband of 21 years   18-[name]    11-[name]       Dog- lab/beagle       Enjoys: spending time with family, fishing        Diet: eats all food groups veggies, cooks with butters and oils   Caffeine: 1 cup coffee in morning, sundrop lunch- 2-3 cups daily   Water: 1 bottle daily       Wears seat belt    Smoke and carbon monoxide detectors   Does not use phone while driving    Social Determinants of Health   Financial Resource Strain: Low Risk   . Difficulty of Paying Living Expenses: Not hard at all  Food Insecurity: No Food Insecurity  . Worried About Programme researcher, broadcasting/film/video in the Last Year: Never true  . Ran Out of Food in the Last Year: Never true  Transportation Needs: No Transportation Needs  . Lack of Transportation (Medical): No  . Lack of Transportation (Non-Medical): No  Physical Activity: Insufficiently Active  . Days of Exercise per Week: 3 days  . Minutes of Exercise per Session: 40 min  Stress: No Stress Concern Present  .  Feeling of Stress : Not at all  Social Connections: Moderately Integrated  . Frequency of Communication with Friends and Family: More than three times a week  . Frequency of Social Gatherings with Friends and Family: More than three times a week  . Attends Religious Services: More than 4 times per year  . Active Member of Clubs or Organizations: No  . Attends Banker Meetings: Never  . Marital Status: Married  Catering manager Violence: Not At Risk  . Fear of Current or Ex-Partner: No  . Emotionally Abused: No  . Physically Abused: No  . Sexually Abused: No    FAMILY HISTORY: Family History  Problem Relation Age of Onset  . Heart disease Mother   . Heart disease Father     ALLERGIES:  has No Known Allergies.  MEDICATIONS:  No current  outpatient medications on file.   No current facility-administered medications for this visit.    REVIEW OF SYSTEMS:   Review of Systems - Endocrine ROS: positive for - hot flashes   All other systems were reviewed with the patient and are negative.  PHYSICAL EXAMINATION: ECOG PERFORMANCE STATUS: 1 - Symptomatic but completely ambulatory  Vitals:   12/17/20 1258  BP: 117/86  Pulse: 79  Resp: 16  Temp: (!) 97 F (36.1 C)  SpO2: 99%   Filed Weights   12/17/20 1258  Weight: 142 lb 13.7 oz (64.8 kg)    Physical Exam Constitutional:      General: Vital signs are normal.     Appearance: Normal appearance.  HENT:     Head: Normocephalic and atraumatic.  Eyes:     Pupils: Pupils are equal, round, and reactive to light.  Cardiovascular:     Rate and Rhythm: Normal rate and regular rhythm.     Heart sounds: Normal heart sounds. No murmur heard.   Pulmonary:     Effort: Pulmonary effort is normal.     Breath sounds: Normal breath sounds. No wheezing.  Abdominal:     General: Bowel sounds are normal. There is no distension.     Palpations: Abdomen is soft.     Tenderness: There is no abdominal tenderness.  Musculoskeletal:        General: No edema. Normal range of motion.     Cervical back: Normal range of motion.  Skin:    General: Skin is warm and dry.     Findings: No rash.  Neurological:     Mental Status: She is alert and oriented to person, place, and time.  Psychiatric:        Judgment: Judgment normal.       LABORATORY DATA:  I have reviewed the data as listed Lab Results  Component Value Date   WBC 10.2 12/17/2020   HGB 17.2 (H) 12/17/2020   HCT 51.0 (H) 12/17/2020   MCV 98.3 12/17/2020   PLT 327 12/17/2020   Lab Results  Component Value Date   NA 141 09/27/2020   K 4.8 09/27/2020   CL 102 09/27/2020   CO2 20 09/27/2020    RADIOGRAPHIC STUDIES: I have personally reviewed the radiological reports and agreed with the findings in the  report.  ASSESSMENT AND PLAN:   Assessment:  Polycythemia: -Secondary to smoking. -Hematology work-up included JAK2 and erythropoieten level which were both negative/normal. -Denies any OSA, heart or lung problems -Relatively asymptomatic-having hot flashes  Plan: -Donated blood to [facility] in mid December 2021. -She has not had repeat lab work done  since. -Repeat labs today (CBC with differential).  I will call with results.  -Labs from today show a hemoglobin of 17.2 hematocrit of 51 -I would recommend holding off on additional blood donation until we repeat labs given significant improvement of her hemoglobin and she is essentially asymptomatic.   Disposition:  -RTC in 3 months for lab work and virtual visit.   Mauro Kaufmann, NP 12/17/20

## 2020-12-18 MED ORDER — FULVESTRANT 250 MG/5ML IM SOLN
INTRAMUSCULAR | Status: AC
  Filled 2020-12-18: qty 10

## 2021-02-10 ENCOUNTER — Other Ambulatory Visit (HOSPITAL_COMMUNITY): Payer: Self-pay

## 2021-02-10 DIAGNOSIS — D751 Secondary polycythemia: Secondary | ICD-10-CM

## 2021-02-11 ENCOUNTER — Inpatient Hospital Stay (HOSPITAL_COMMUNITY): Payer: BC Managed Care – PPO | Attending: Hematology

## 2021-02-11 ENCOUNTER — Other Ambulatory Visit: Payer: Self-pay

## 2021-02-11 DIAGNOSIS — D751 Secondary polycythemia: Secondary | ICD-10-CM | POA: Insufficient documentation

## 2021-02-11 LAB — CBC WITH DIFFERENTIAL/PLATELET
Abs Immature Granulocytes: 0.05 10*3/uL (ref 0.00–0.07)
Basophils Absolute: 0.1 10*3/uL (ref 0.0–0.1)
Basophils Relative: 1 %
Eosinophils Absolute: 0.4 10*3/uL (ref 0.0–0.5)
Eosinophils Relative: 3 %
HCT: 53.5 % — ABNORMAL HIGH (ref 36.0–46.0)
Hemoglobin: 17.7 g/dL — ABNORMAL HIGH (ref 12.0–15.0)
Immature Granulocytes: 0 %
Lymphocytes Relative: 37 %
Lymphs Abs: 4.4 10*3/uL — ABNORMAL HIGH (ref 0.7–4.0)
MCH: 32.9 pg (ref 26.0–34.0)
MCHC: 33.1 g/dL (ref 30.0–36.0)
MCV: 99.4 fL (ref 80.0–100.0)
Monocytes Absolute: 1.1 10*3/uL — ABNORMAL HIGH (ref 0.1–1.0)
Monocytes Relative: 9 %
Neutro Abs: 5.8 10*3/uL (ref 1.7–7.7)
Neutrophils Relative %: 50 %
Platelets: 307 10*3/uL (ref 150–400)
RBC: 5.38 MIL/uL — ABNORMAL HIGH (ref 3.87–5.11)
RDW: 14 % (ref 11.5–15.5)
WBC: 11.7 10*3/uL — ABNORMAL HIGH (ref 4.0–10.5)
nRBC: 0 % (ref 0.0–0.2)

## 2021-02-13 ENCOUNTER — Other Ambulatory Visit: Payer: Self-pay

## 2021-02-13 ENCOUNTER — Inpatient Hospital Stay (HOSPITAL_BASED_OUTPATIENT_CLINIC_OR_DEPARTMENT_OTHER): Payer: BC Managed Care – PPO | Admitting: Oncology

## 2021-02-13 DIAGNOSIS — D751 Secondary polycythemia: Secondary | ICD-10-CM | POA: Diagnosis not present

## 2021-02-13 NOTE — Progress Notes (Signed)
[facility] CONSULT NOTE  Patient Care Team: Freddy Finner, NP as PCP - General (Family Medicine)  CHIEF COMPLAINTS/PURPOSE OF CONSULTATION:  Elevated hemoglobin  I connected with [name] on 02/13/21 at  1:00 PM EDT by telephone visit and verified that I am speaking with the correct person using two identifiers.   I discussed the limitations, risks, security and privacy concerns of performing an evaluation and management service by telemedicine and the availability of in-person appointments. I also discussed with the patient that there may be a patient responsible charge related to this service. The patient expressed understanding and agreed to proceed.   Other persons participating in the visit and their role in the encounter: None   Patient's location: Home Provider's location: Clinic   HISTORY OF PRESENTING ILLNESS:  [name] 45 y.o. female is here because of recent diagnosis of markedly elevated hemoglobin of 19.6.  She was last seen in clinic on 12/17/20.  [name] is a 45 year old female who is overall healthy who presented to our clinic for an elevated hemoglobin.  She was seen by Dr. Pamelia Hoit.  She is a current everyday smoker.  She denied any neurological symptoms.  Denied any sleep apnea, COPD or asthma.  Today, she presents to review lab work.  She feels overall the same from previous.  She continues to have hot flashes.  She assumed she was going through menopause.  She is status hysterectomy with ovaries still intact.  She donated blood back in December to the [facility].  She did not note any difference in how she feels.  She denies any neurological complaints such as headaches, changes in her vision or strokelike symptoms.  She still smokes.  Had the GI bug on the day she had lab work (02/11/21) with diarrhea and nausea and vomiting.  Symptoms were self-limited and have resolved.   SUMMARY OF ONCOLOGIC HISTORY: Oncology History   No  history exists.    MEDICAL HISTORY:  No past medical history on file.  SURGICAL HISTORY: Past Surgical History:  Procedure Laterality Date  . ABDOMINAL HYSTERECTOMY    . KIDNEY STONE SURGERY      SOCIAL HISTORY: Social History   Socioeconomic History  . Marital status: Married    Spouse name: [name]   . Number of children: 2  . Years of education: Not on file  . 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 Smoking status: Current Every Day Smoker    Packs/day: 0.50    Years: 28.00    Pack years: 14.00    Types: Cigarettes  . Smokeless tobacco: Never Used  Substance and Sexual Activity  . Alcohol use: Yes    Comment: wine or beer 5 times a week   . Drug use: Never  . Sexual activity: Yes    Birth control/protection: Surgical  Other Topics Concern  . Not on file  Social History Narrative   Lives with [name] husband of 21 years   18-[name]    11-[name]       Dog- lab/beagle       Enjoys: spending time with family, fishing        Diet: eats all food groups veggies, cooks with butters and oils   Caffeine: 1 cup coffee in morning, sundrop lunch- 2-3 cups daily   Water: 1 bottle daily  Wears seat belt    Smoke and carbon monoxide detectors   Does not use phone while driving    Social Determinants of Health   Financial Resource Strain: Low Risk   . Difficulty of Paying Living Expenses: Not hard at all  Food Insecurity: No Food Insecurity  . Worried About Programme researcher, broadcasting/film/video in the Last Year: Never true  . Ran Out of Food in the Last Year: Never true  Transportation Needs: No Transportation Needs  . Lack of Transportation (Medical): No  . Lack of Transportation (Non-Medical): No  Physical Activity: Insufficiently Active  . Days of Exercise per Week: 3 days  . Minutes of Exercise per Session: 40 min  Stress: No Stress Concern Present   . Feeling of Stress : Not at all  Social Connections: Moderately Integrated  . Frequency of Communication with Friends and Family: More than three times a week  . Frequency of Social Gatherings with Friends and Family: More than three times a week  . Attends Religious Services: More than 4 times per year  . Active Member of Clubs or Organizations: No  . Attends Banker Meetings: Never  . Marital Status: Married  Catering manager Violence: Not At Risk  . Fear of Current or Ex-Partner: No  . Emotionally Abused: No  . Physically Abused: No  . Sexually Abused: No    FAMILY HISTORY: Family History  Problem Relation Age of Onset  . Heart disease Mother   . Heart disease Father     ALLERGIES:  has No Known Allergies.  MEDICATIONS:  No current outpatient medications on file.   No current facility-administered medications for this visit.    REVIEW OF SYSTEMS:   Review of Systems - Endocrine ROS: positive for - hot flashes   All other systems were reviewed with the patient and are negative.  PHYSICAL EXAMINATION: ECOG PERFORMANCE STATUS: 1 - Symptomatic but completely ambulatory  There were no vitals filed for this visit. There were no vitals filed for this visit.  Physical Exam Neurological:     Mental Status: She is alert and oriented to person, place, and time.       LABORATORY DATA:  I have reviewed the data as listed Lab Results  Component Value Date   WBC 11.7 (H) 02/11/2021   HGB 17.7 (H) 02/11/2021   HCT 53.5 (H) 02/11/2021   MCV 99.4 02/11/2021   PLT 307 02/11/2021   Lab Results  Component Value Date   NA 141 09/27/2020   K 4.8 09/27/2020   CL 102 09/27/2020   CO2 20 09/27/2020    RADIOGRAPHIC STUDIES: I have personally reviewed the radiological reports and agreed with the findings in the report.  ASSESSMENT AND PLAN:   Assessment:  Polycythemia: -Secondary to smoking. -Hematology work-up included JAK2 and erythropoieten level  which were both negative/normal. -Denies any OSA, heart or lung problems.  -Relatively asymptomatic-having hot flashes.   Plan: -Donated blood to [facility] in mid December 2021. -Repeat labs on 12/17/2020 showed a hemoglobin of 17.2 and hematocrit 51. -Recommended holding off on phlebotomy at that time and recheck of lab work in approximately 2 months. -Labs from 02/11/2021 show a hemoglobin of 17.7 and hematocrit of 53.5. -Recommend she donate blood in the next 1 to 2 months with follow-up in 3 to 4 months with labs.  -Continues to remain asymptomatic. -She continues to smoke.  Disposition:  -RTC in 3-4 months for lab work (CBC) and virtual visit.  I provided 12 minutes of non face-to-face telephone visit time during this encounter, and > 50% was spent counseling as documented under my assessment & plan.   Mauro Kaufmann, NP 02/13/21

## 2021-06-03 ENCOUNTER — Other Ambulatory Visit (HOSPITAL_COMMUNITY): Payer: Self-pay | Admitting: Surgery

## 2021-06-03 DIAGNOSIS — D751 Secondary polycythemia: Secondary | ICD-10-CM

## 2021-06-04 ENCOUNTER — Other Ambulatory Visit: Payer: Self-pay

## 2021-06-04 ENCOUNTER — Inpatient Hospital Stay (HOSPITAL_COMMUNITY): Payer: BC Managed Care – PPO | Attending: Hematology

## 2021-06-04 DIAGNOSIS — D751 Secondary polycythemia: Secondary | ICD-10-CM | POA: Diagnosis present

## 2021-06-04 DIAGNOSIS — F17210 Nicotine dependence, cigarettes, uncomplicated: Secondary | ICD-10-CM | POA: Diagnosis not present

## 2021-06-04 LAB — CBC WITH DIFFERENTIAL/PLATELET
Abs Immature Granulocytes: 0.03 10*3/uL (ref 0.00–0.07)
Basophils Absolute: 0.1 10*3/uL (ref 0.0–0.1)
Basophils Relative: 1 %
Eosinophils Absolute: 0.4 10*3/uL (ref 0.0–0.5)
Eosinophils Relative: 4 %
HCT: 50.7 % — ABNORMAL HIGH (ref 36.0–46.0)
Hemoglobin: 17 g/dL — ABNORMAL HIGH (ref 12.0–15.0)
Immature Granulocytes: 0 %
Lymphocytes Relative: 42 %
Lymphs Abs: 4.1 10*3/uL — ABNORMAL HIGH (ref 0.7–4.0)
MCH: 33.9 pg (ref 26.0–34.0)
MCHC: 33.5 g/dL (ref 30.0–36.0)
MCV: 101.2 fL — ABNORMAL HIGH (ref 80.0–100.0)
Monocytes Absolute: 0.7 10*3/uL (ref 0.1–1.0)
Monocytes Relative: 8 %
Neutro Abs: 4.5 10*3/uL (ref 1.7–7.7)
Neutrophils Relative %: 45 %
Platelets: 340 10*3/uL (ref 150–400)
RBC: 5.01 MIL/uL (ref 3.87–5.11)
RDW: 14.2 % (ref 11.5–15.5)
WBC: 9.8 10*3/uL (ref 4.0–10.5)
nRBC: 0 % (ref 0.0–0.2)

## 2021-06-11 ENCOUNTER — Other Ambulatory Visit: Payer: Self-pay

## 2021-06-11 ENCOUNTER — Inpatient Hospital Stay (HOSPITAL_BASED_OUTPATIENT_CLINIC_OR_DEPARTMENT_OTHER): Payer: BC Managed Care – PPO | Admitting: Physician Assistant

## 2021-06-11 ENCOUNTER — Inpatient Hospital Stay (HOSPITAL_COMMUNITY): Payer: BC Managed Care – PPO | Admitting: Hematology

## 2021-06-11 VITALS — BP 122/74 | HR 91 | Temp 97.1°F | Resp 18 | Wt 135.4 lb

## 2021-06-11 DIAGNOSIS — D751 Secondary polycythemia: Secondary | ICD-10-CM

## 2021-06-11 NOTE — Patient Instructions (Signed)
[facility] at [facility] Discharge Instructions  You were seen today by Rojelio Brenner PA-C for your elevated red blood cells ("polycythemia").  The elevation in your red blood cells is caused by your tobacco smoking.  In order to keep your red blood cells at a safe level, we recommend that you donate blood ("phlebotomy") every 2 months.  However, the best thing that you can do is to stop smoking, as this will help to prevent further elevation in your red blood cells as well as other damage caused by cigarettes.  Elevated red blood cells can cause symptoms such as headache, intermittent vision changes, ringing in your ears, and an increased risk of blood clots.  If you experience these symptoms, please let us know.  There is also an increased risk of heart attack and stroke.  We recommend that you start taking 81 mg aspirin to help decrease this risk.  LABS: Return in 6 months for repeat labs  OTHER TESTS: None  MEDICATIONS: Start taking 81 mg aspirin daily  FOLLOW-UP APPOINTMENT: Office visit in 6 months                                                                                                 _____________________________________________________________  Thank you for choosing [facility] at [facility] to provide your oncology and hematology care.  To afford each patient quality time with our provider, please arrive at least 15 minutes before your scheduled appointment time.   If you have a lab appointment with the Cancer Center please come in thru the Main Entrance and check in at the main information desk.  You need to re-schedule your appointment should you arrive 10 or more minutes late.  We strive to give you quality time with our providers, and arriving late affects you and other patients whose appointments are after yours.  Also, if you no show three or more times for appointments you may be dismissed from the clinic at the  providers discretion.     Again, thank you for choosing [facility].  Our hope is that these requests will decrease the amount of time that you wait before being seen by our physicians.       _____________________________________________________________  Should you have questions after your visit to [facility], please contact our office at [phone] and follow the prompts.  Our office hours are 8:00 a.m. and 4:30 p.m. Monday - Friday.  Please note that voicemails left after 4:00 p.m. may not be returned until the following business day.  We are closed weekends and major holidays.  You do have access to a nurse 24-7, just call the main number to the clinic [phone] and do not press any options, hold on the line and a nurse will answer the phone.    For prescription refill requests, have your pharmacy contact our office and allow 72 hours.    Due to Covid, you will need to wear a mask upon entering the hospital. If you do not have a mask, a mask will  be given to you at the Main Entrance upon arrival. For doctor visits, patients may have 1 support person age 36 or older with them. For treatment visits, patients can not have anyone with them due to social distancing guidelines and our immunocompromised population.

## 2021-06-11 NOTE — Progress Notes (Signed)
[facility] [address], [place] [address]   CLINIC:  Medical Oncology/Hematology  PCP:  Heather Roberts, NP [address]  [address] [place] [address] [phone]   REASON FOR VISIT:  Follow-up for secondary polycythemia  CURRENT THERAPY: [facility]termitt[facility] ([facility])  INTERVAL HISTORY:  [name] 45 y.o. 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  She was last evaluated by NP Durenda Hurt via telemedicine visit on 02/13/2021.  At today's visit, she reports feeling fairly well.  No recent hospitalizations, surgeries, or changes in baseline health status.  Patient reports that she donated blood at the [facility] at the end of May 2022 (about 2 months ago).  She continues to smoke about 1 pack/day of cigarettes.  She admits to some intermittent tinnitus, but denies any other vasomotor symptoms such as headache, blurry vision, Raynaud's phenomenon.  No erythromelalgia or aquagenic pruritus.  No current signs or symptoms of blood clots, no personal history of DVT or PE.  She denies B symptoms such as fever, chills, night sweats, unintentional weight loss.  She has been having some hot flashes for the past year, reports that she had her hormone levels checked by her gynecologist, who told her that she was currently in menopause.  She has 75% energy and 100% appetite. She endorses that she is maintaining a stable weight.    REVIEW OF SYSTEMS:  Review of Systems  Constitutional:  Negative for appetite change, chills, diaphoresis, fatigue, fever and unexpected weight change.  HENT:   Positive for tinnitus. Negative for lump/mass and nosebleeds.   Eyes:  Negative for eye problems.  Respiratory:  Negative for cough, hemoptysis and shortness of breath.   Cardiovascular:  Negative for chest pain, leg swelling and palpitations.  Gastrointestinal:  Negative for abdominal pain, blood in stool,  constipation, diarrhea, nausea and vomiting.  Endocrine: Positive for hot flashes.  Genitourinary:  Negative for hematuria.   Skin: Negative.   Neurological:  Negative for dizziness, headaches and light-headedness.  Hematological:  Does not bruise/bleed easily.     PAST MEDICAL/SURGICAL HISTORY:  No past medical history on file. 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, museum/gallery  Tobacco Use   Smoking status: Every Day    Packs/day: 0.50    Years: 28.00    Pack years: 14.00    Types: Cigarettes   Smokeless tobacco: Never  Substance and Sexual Activity   Alcohol use: Yes    Comment: wine or beer 5 times a week    Drug use: Never   Sexual activity: Yes    Birth control/protection: Surgical  Other Topics Concern   Not on file  Social History Narrative   Lives with Fayrene Fearing husband of 21 years   18-Payten    11-Jaxen       Dog- lab/beagle       Enjoys: spending time with family, fishing        Diet: eats all food groups veggies, cooks with butters and  oils   Caffeine: 1 cup coffee in morning, sundrop lunch- 2-3 cups daily   Water: 1 bottle daily       Wears seat belt    Smoke and carbon monoxide detectors   Does not use phone while driving    Social Determinants of Corporate investment banker Strain: Low Risk    Difficulty of Paying Living Expenses: Not hard at all  Food Insecurity: No Food Insecurity   Worried About Programme researcher, broadcasting/film/video in the Last Year: Never true   Barista in the Last Year: Never true  Transportation Needs: No Transportation Needs   Lack of Transportation  (Medical): No   Lack of Transportation (Non-Medical): No  Physical Activity: Insufficiently Active   Days of Exercise per Week: 3 days   Minutes of Exercise per Session: 40 min  Stress: No Stress Concern Present   Feeling of Stress : Not at all  Social Connections: Moderately Integrated   Frequency of Communication with Friends and Family: More than three times a week   Frequency of Social Gatherings with Friends and Family: More than three times a week   Attends Religious Services: More than 4 times per year   Active Member of [employer] or Organizations: No   Attends Engineer, structural: Never   Marital Status: Married  Catering manager Violence: Not At Risk   Fear of Current or Ex-Partner: No   Emotionally Abused: No   Physically Abused: No   Sexually Abused: No    FAMILY HISTORY:  Family History  Problem Relation Age of Onset   Heart disease Mother    Heart disease Father     CURRENT MEDICATIONS:  No outpatient encounter medications on file as of 06/11/2021.   No facility-administered encounter medications on file as of 06/11/2021.    ALLERGIES:  No Known Allergies   PHYSICAL EXAM:  ECOG PERFORMANCE STATUS: 1 - Symptomatic but completely ambulatory  Vitals:   06/11/21 1342  BP: 122/74  Pulse: 91  Resp: 18  Temp: (!) 97.1 F (36.2 C)  SpO2: 99%   Filed Weights   06/11/21 1342  Weight: 135 lb 6.4 oz (61.4 kg)   Physical Exam Constitutional:      Appearance: Normal appearance.  HENT:     Head: Normocephalic and atraumatic.     Mouth/Throat:     Mouth: Mucous membranes are moist.  Eyes:     Extraocular Movements: Extraocular movements intact.     Pupils: Pupils are equal, round, and reactive to light.  Cardiovascular:     Rate and Rhythm: Normal rate and regular rhythm.     Pulses: Normal pulses.     Heart sounds: Normal heart sounds.  Pulmonary:     Effort: Pulmonary effort is normal.     Breath sounds: Normal breath sounds.  Abdominal:      General: Bowel sounds are normal.     Palpations: Abdomen is soft.     Tenderness: There is no abdominal tenderness.  Musculoskeletal:        General: No swelling.     Right lower leg: No edema.     Left lower leg: No edema.  Lymphadenopathy:     Cervical: No cervical adenopathy.  Skin:    General: Skin is warm and dry.  Neurological:     General: No focal deficit present.     Mental Status: She is alert and oriented to person, place, and time.  Psychiatric:  Mood and Affect: Mood normal.        Behavior: Behavior normal.     LABORATORY DATA:  I have reviewed the labs as listed.  CBC    Component Value Date/Time   WBC 9.8 06/04/2021 1330   RBC 5.01 06/04/2021 1330   HGB 17.0 (H) 06/04/2021 1330   HGB 19.6 (H) 09/27/2020 1024   HCT 50.7 (H) 06/04/2021 1330   HCT 56.7 (H) 09/27/2020 1024   PLT 340 06/04/2021 1330   PLT 322 09/27/2020 1024   MCV 101.2 (H) 06/04/2021 1330   MCV 92 09/27/2020 1024   MCH 33.9 06/04/2021 1330   MCHC 33.5 06/04/2021 1330   RDW 14.2 06/04/2021 1330   RDW 13.5 09/27/2020 1024   LYMPHSABS 4.1 (H) 06/04/2021 1330   MONOABS 0.7 06/04/2021 1330   EOSABS 0.4 06/04/2021 1330   BASOSABS 0.1 06/04/2021 1330   CMP Latest Ref Rng & Units 09/27/2020 04/04/2020 12/20/2019  Glucose 65 - 99 mg/dL 89 85 85  BUN 6 - 24 mg/dL 10 18 14   Creatinine 0.57 - 1.00 mg/dL 1.06 2.69  Sodium 134 - 144 mmol/L 141 140 140  Potassium 3.5 - 5.2 mmol/L 4.8 4.4 4.9  Chloride 96 - 106 mmol/L 102 106 105  CO2 20 - 29 mmol/L 20 26 25   Calcium 8.7 - 10.2 mg/dL 9.8 9.5 9.8  Total Protein 6.0 - 8.5 g/dL 6.5 6.2 6.6  Total Bilirubin 0.0 - 1.2 mg/dL 0.4 0.4 0.4  Alkaline Phos 44 - 121 IU/L 84 - -  AST 0 - 40 IU/L 20 21 15   ALT 0 - 32 IU/L 20 20 16     DIAGNOSTIC IMAGING:  I have independently reviewed the relevant imaging and discussed with the patient.  ASSESSMENT & PLAN: 1.  Secondary polycythemia - Hematology work-up including JAK2, CALR, MPL, and  erythropoietin levels were all negative/normal - No history of obstructive sleep apnea or cardiopulmonary disease - Currently smokes 1 pack/day cigarettes - [facility] (approximately 2 months ago) - Mildly symptomatic with tinnitus, but no other vasomotor symptoms, erythromelalgia, or aquagenic pruritus - Most recent labs (06/04/2021): CBC elevated with Hgb 17.0 and HCT 50.7 - Goal of phlebotomy is to maintain hematocrit < 54.0, or can be performed in the setting of severe symptoms such as strokelike symptoms, severe recurrent headaches, or severe fatigue - PLAN: Recommend to start taking 81 mg aspirin daily.  Phlebotomy at every 2 months.  RTC for repeat labs and follow-up in 6 months.  2.  Lymphocytosis - CBC shows mildly elevated lymphocytes 4.1, previously was 4.4 - Likely reactive lymphocytosis in the setting of smoking - PLAN: Repeat CBC in 6 months.  If lymphocytes remain elevated, consider flow cytometry.  3.  Tobacco use -Patient smokes 1 pack/day cigarettes - She does not currently qualify for LDCT lung cancer screening, as she is less than 85 years old at this time - PLAN: Smoking cessation counseling took place for approximately 10 minutes of today's visit.  Patient was given information on smoking cessation classes, and reports that she will think about it and discuss further with her PCP.   PLAN SUMMARY & DISPOSITION: -Phlebotomy every 2 months at the 04/17/2021 and RTC in 6 months  All questions were answered. The patient knows to call the clinic with any problems, questions or concerns.  Medical decision making: Moderate  Time spent on visit: I spent 20 minutes counseling the patient face to  face. The total time spent in the appointment was 30 minutes and more than 50% was on counseling.   Carnella GuadalajaraRebekah M Yariana Hoaglund, PA-C  06/11/2021 2:48 PM

## 2021-09-25 ENCOUNTER — Encounter: Payer: BC Managed Care – PPO | Admitting: Family Medicine

## 2021-09-25 ENCOUNTER — Encounter: Payer: BC Managed Care – PPO | Admitting: Nurse Practitioner

## 2021-10-25 IMAGING — MG MM DIGITAL DIAGNOSTIC UNILAT*L* W/ TOMO W/ CAD
5 series · 6 of 9 positions shown · non-contrast
Comparison: Screening mammogram dated [DATE]

CLINICAL DATA: Screening recall for left breast calcifications.

EXAM:
DIGITAL DIAGNOSTIC UNILATERAL LEFT MAMMOGRAM WITH TOMO AND CAD
TECHNIQUE: Left digital diagnostic mammography and breast tomosynthesis was
performed. Digital images of the breasts were evaluated with
computer-aided detection.

[L ML (1 of 2)]
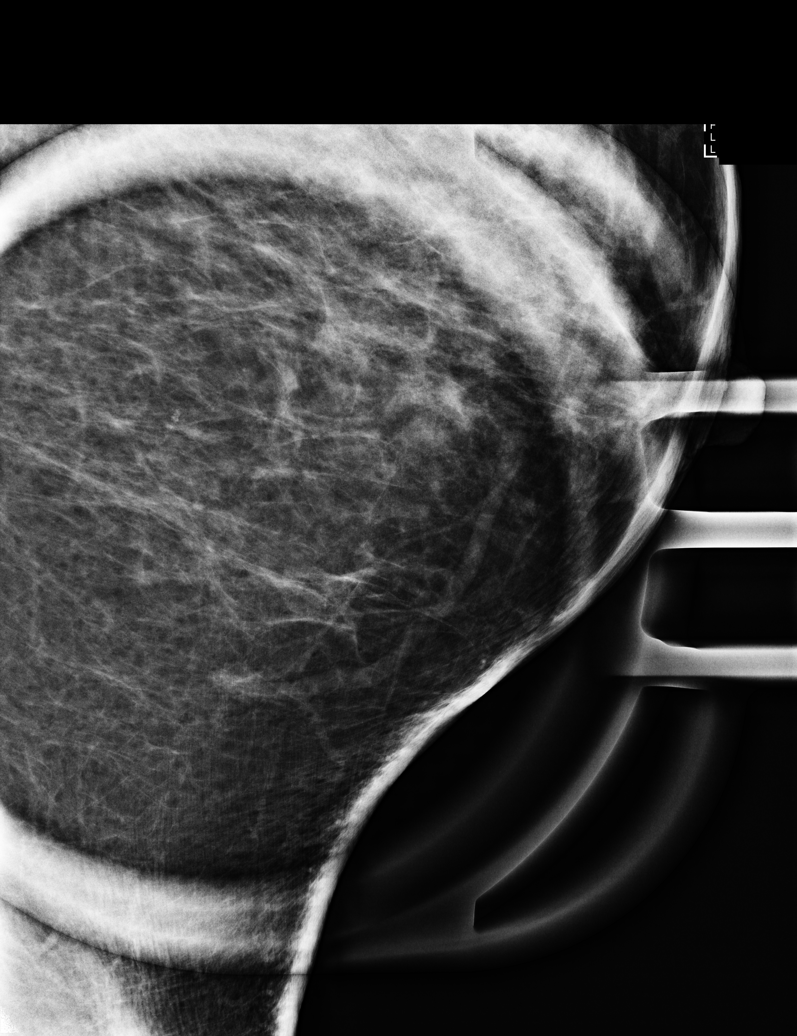

[L CC]
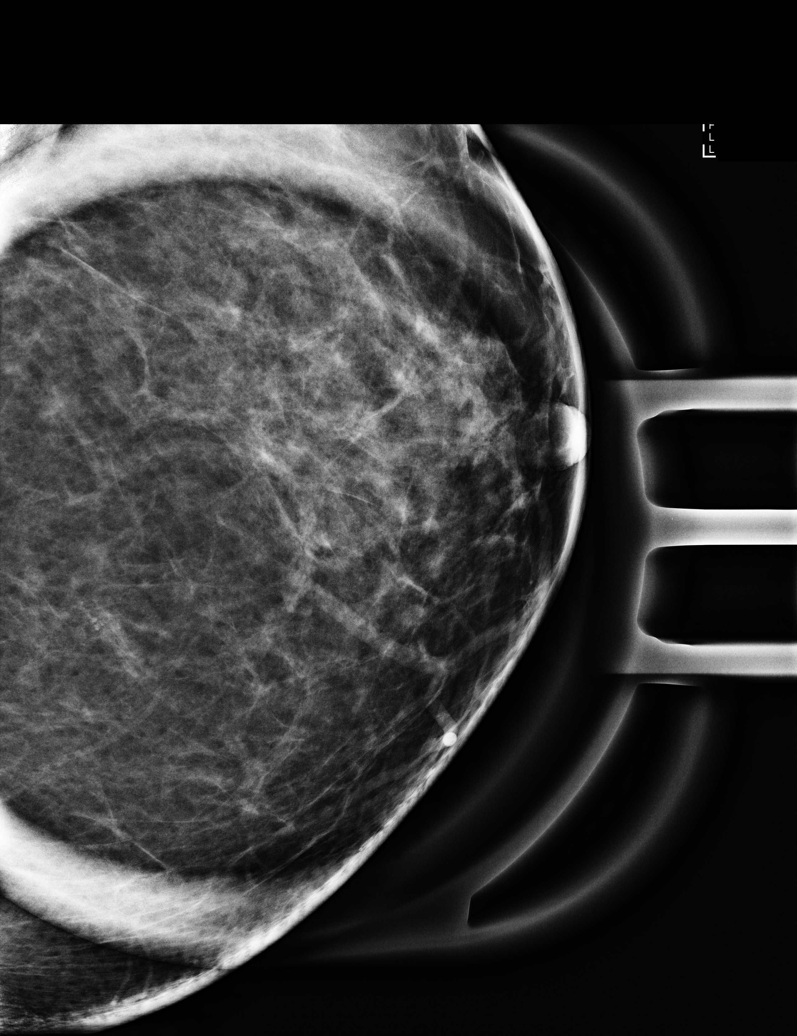

[L ML (2 of 2)]
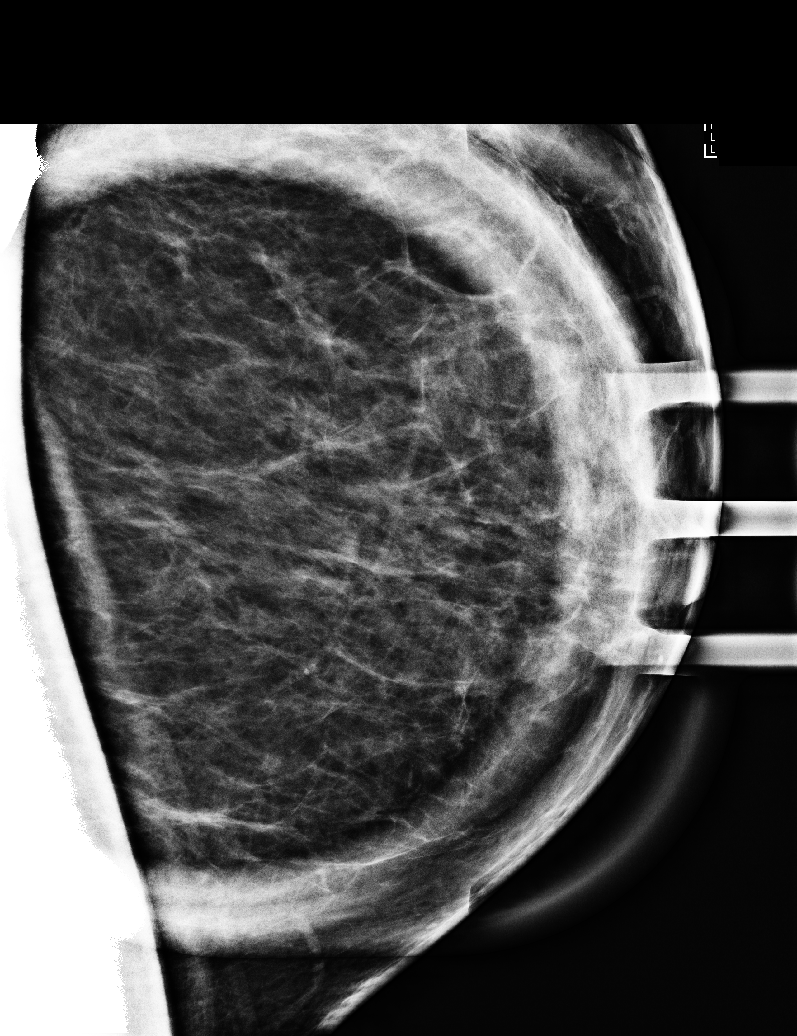

[L ML synth-2D]
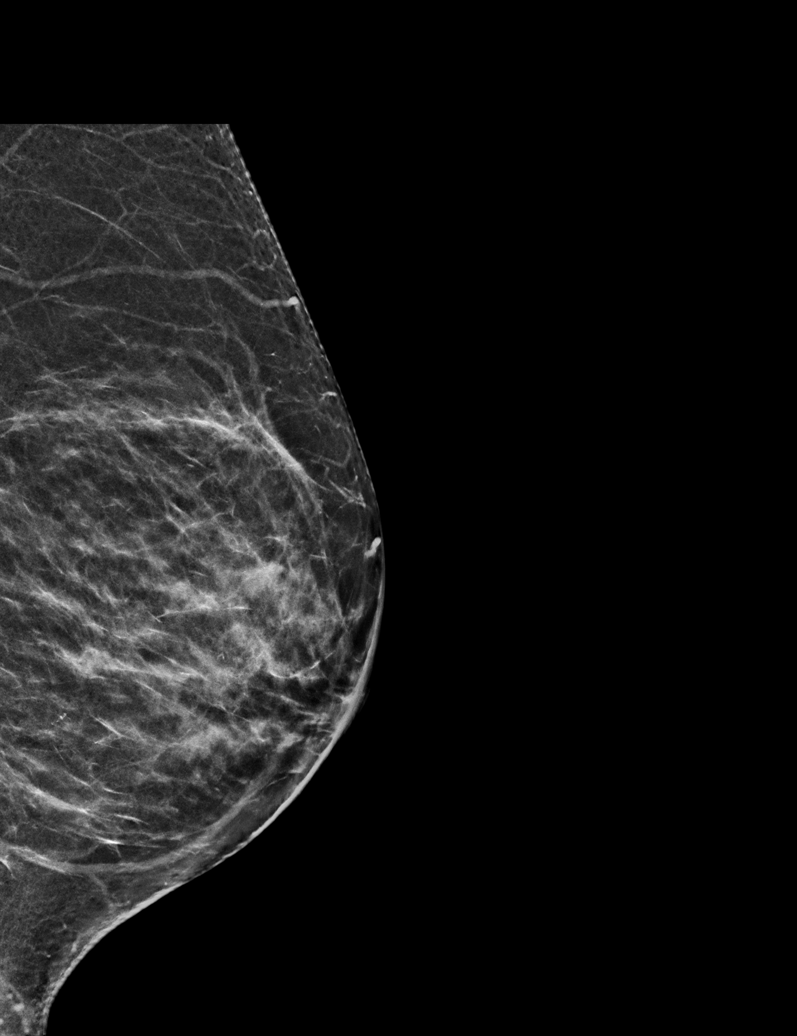

[L ML tomo · 2 of 58 frames shown]
[frame 19/58]
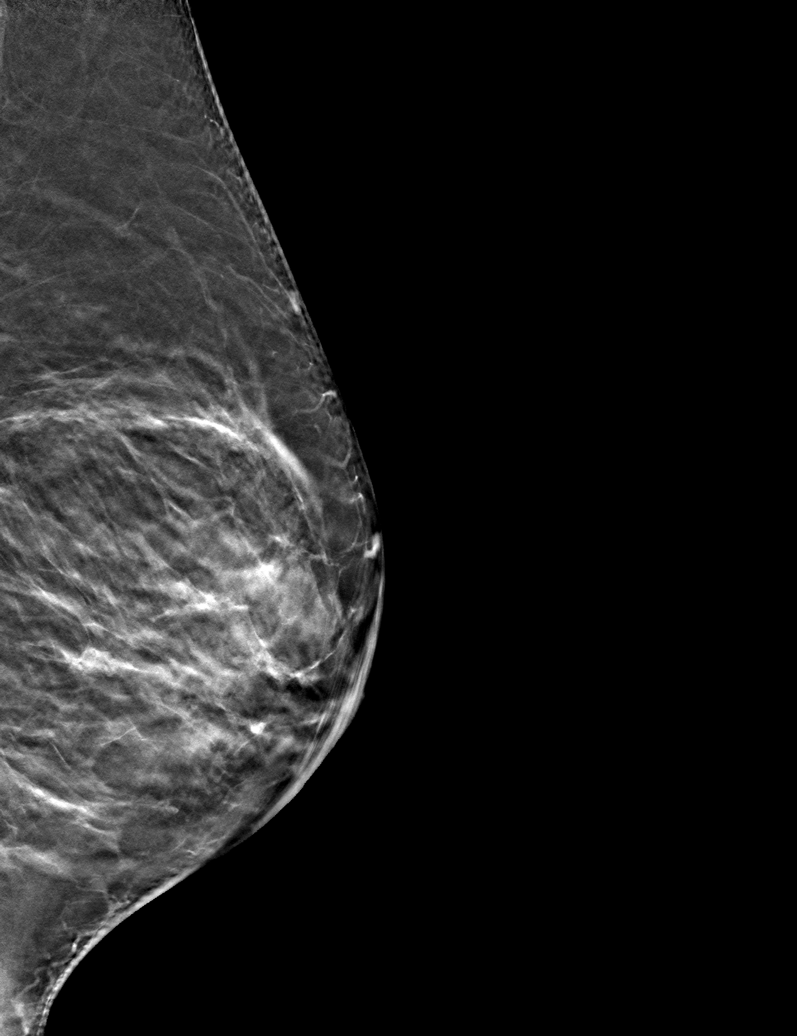
[frame 29/58]
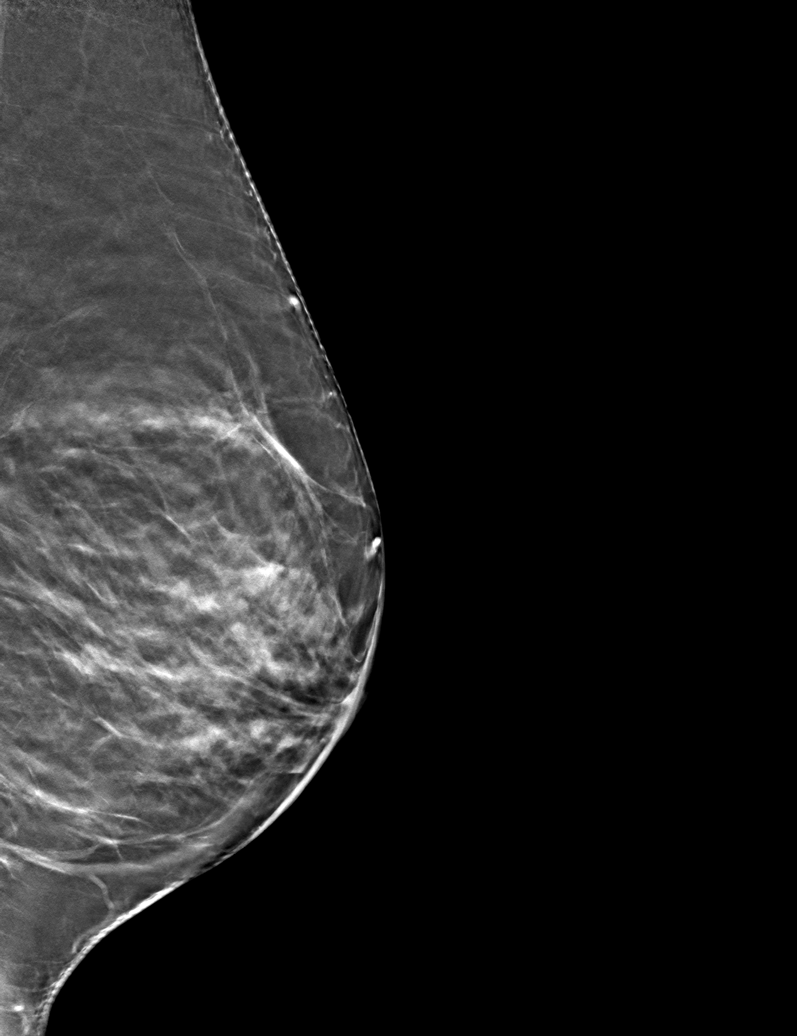

[6 of 9 positions shown; findings below may reference images not displayed]

ACR Breast Density Category c: The breast tissue is heterogeneously
dense, which may obscure small masses.
FINDINGS: Spot compression magnification views were performed over the lower
inner far posterior left breast demonstrating a less than 3 mm group
of round calcifications.
IMPRESSION: Probably benign left breast calcifications.

RECOMMENDATION:
Diagnostic mammography with magnification views of the left breast
in 6 months.

I have discussed the findings and recommendations with the patient.
If applicable, a reminder letter will be sent to the patient
regarding the next appointment.

BI-RADS CATEGORY  3: Probably benign.

## 2021-12-11 ENCOUNTER — Inpatient Hospital Stay (HOSPITAL_COMMUNITY): Payer: BC Managed Care – PPO

## 2021-12-18 ENCOUNTER — Ambulatory Visit (HOSPITAL_COMMUNITY): Payer: BC Managed Care – PPO | Admitting: Physician Assistant

## 2021-12-26 ENCOUNTER — Inpatient Hospital Stay (HOSPITAL_COMMUNITY): Payer: BC Managed Care – PPO

## 2022-01-01 ENCOUNTER — Ambulatory Visit (HOSPITAL_COMMUNITY): Payer: BC Managed Care – PPO | Admitting: Physician Assistant

## 2024-06-12 NOTE — Progress Notes (Signed)
 Erroneous encounter
